# Patient Record
Sex: Male | Born: 1980 | Race: White | Hispanic: No | Marital: Single | Smoking: Never smoker
Health system: Southern US, Community
[De-identification: ages and names within clinical notes are randomized; demographics above are authoritative.]

## PROBLEM LIST (undated history)

## (undated) DIAGNOSIS — J45909 Unspecified asthma, uncomplicated: Secondary | ICD-10-CM

## (undated) HISTORY — DX: Unspecified asthma, uncomplicated: J45.909

## (undated) HISTORY — PX: OTHER SURGICAL HISTORY: SHX169

---
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
## 1999-02-11 ENCOUNTER — Ambulatory Visit (HOSPITAL_COMMUNITY): Admission: RE | Admit: 1999-02-11 | Discharge: 1999-02-11 | Payer: Self-pay | Admitting: *Deleted

## 1999-02-20 ENCOUNTER — Ambulatory Visit (HOSPITAL_COMMUNITY): Admission: RE | Admit: 1999-02-20 | Discharge: 1999-02-20 | Payer: Self-pay | Admitting: *Deleted

## 1999-02-20 ENCOUNTER — Encounter: Admission: RE | Admit: 1999-02-20 | Discharge: 1999-02-20 | Payer: Self-pay | Admitting: *Deleted

## 1999-09-10 ENCOUNTER — Ambulatory Visit (HOSPITAL_COMMUNITY): Admission: RE | Admit: 1999-09-10 | Discharge: 1999-09-10 | Payer: Self-pay | Admitting: *Deleted

## 1999-10-06 ENCOUNTER — Encounter: Admission: RE | Admit: 1999-10-06 | Discharge: 1999-10-06 | Payer: Self-pay | Admitting: *Deleted

## 1999-10-06 ENCOUNTER — Ambulatory Visit (HOSPITAL_COMMUNITY): Admission: RE | Admit: 1999-10-06 | Discharge: 1999-10-06 | Payer: Self-pay | Admitting: *Deleted

## 2000-06-25 ENCOUNTER — Ambulatory Visit (HOSPITAL_COMMUNITY): Admission: RE | Admit: 2000-06-25 | Discharge: 2000-06-25 | Payer: Self-pay | Admitting: *Deleted

## 2000-07-26 ENCOUNTER — Encounter: Admission: RE | Admit: 2000-07-26 | Discharge: 2000-07-26 | Payer: Self-pay | Admitting: *Deleted

## 2000-07-26 ENCOUNTER — Ambulatory Visit (HOSPITAL_COMMUNITY): Admission: RE | Admit: 2000-07-26 | Discharge: 2000-07-26 | Payer: Self-pay | Admitting: *Deleted

## 2009-04-26 ENCOUNTER — Encounter: Admission: RE | Admit: 2009-04-26 | Discharge: 2009-04-26 | Payer: Self-pay | Admitting: Orthopedic Surgery

## 2012-05-10 ENCOUNTER — Encounter (HOSPITAL_BASED_OUTPATIENT_CLINIC_OR_DEPARTMENT_OTHER): Payer: Self-pay

## 2012-05-10 ENCOUNTER — Emergency Department (HOSPITAL_BASED_OUTPATIENT_CLINIC_OR_DEPARTMENT_OTHER)
Admission: EM | Admit: 2012-05-10 | Discharge: 2012-05-10 | Disposition: A | Discharge status: Home / Self Care | Payer: 59 | Attending: Emergency Medicine | Admitting: Emergency Medicine

## 2012-05-10 DIAGNOSIS — N419 Inflammatory disease of prostate, unspecified: Secondary | ICD-10-CM | POA: Insufficient documentation

## 2012-05-10 DIAGNOSIS — R350 Frequency of micturition: Secondary | ICD-10-CM | POA: Insufficient documentation

## 2012-05-10 DIAGNOSIS — M545 Low back pain, unspecified: Secondary | ICD-10-CM | POA: Insufficient documentation

## 2012-05-10 DIAGNOSIS — N390 Urinary tract infection, site not specified: Secondary | ICD-10-CM | POA: Insufficient documentation

## 2012-05-10 LAB — URINALYSIS, ROUTINE W REFLEX MICROSCOPIC
Glucose, UA: NEGATIVE mg/dL
Specific Gravity, Urine: 1.01 (ref 1.005–1.030)

## 2012-05-10 LAB — URINE MICROSCOPIC-ADD ON

## 2012-05-10 MED ORDER — CEFIXIME 400 MG PO TABS
400.0000 mg | ORAL_TABLET | Freq: Once | ORAL | Status: AC
  Administered 2012-05-10: 400 mg via ORAL
  Filled 2012-05-10: qty 1

## 2012-05-10 MED ORDER — CIPROFLOXACIN HCL 500 MG PO TABS
500.0000 mg | ORAL_TABLET | Freq: Once | ORAL | Status: AC
Start: 2012-05-10 — End: 2012-05-10
  Administered 2012-05-10: 500 mg via ORAL
  Filled 2012-05-10: qty 1

## 2012-05-10 MED ORDER — HYDROCODONE-ACETAMINOPHEN 5-325 MG PO TABS: 1.0000 | ORAL_TABLET | ORAL | Status: DC | PRN

## 2012-05-10 MED ORDER — CIPROFLOXACIN HCL 500 MG PO TABS: 500.0000 mg | ORAL_TABLET | Freq: Two times a day (BID) | ORAL | Status: DC

## 2012-05-10 NOTE — ED Notes (Signed)
Low back, low abdominal pain and urinary frequency that started Saturday.

## 2012-05-10 NOTE — ED Provider Notes (Signed)
History     CSN: [id number]  Arrival date & time 05/10/12  4098   First MD Initiated Contact with Patient 05/10/12 1857      Chief Complaint  Patient presents with  . Back Pain  . Abdominal Pain  . Urinary Frequency    (Consider location/radiation/quality/duration/timing/severity/associated sxs/prior treatment) Patient is a 31 y.o. male presenting with abdominal pain and frequency. The history is provided by the patient.  Abdominal Pain The primary symptoms of the illness include abdominal pain. The primary symptoms of the illness do not include shortness of breath or dysuria.  Additional symptoms associated with the illness include chills and frequency. Symptoms associated with the illness do not include hematuria or back pain.  Urinary Frequency This is a new problem. The current episode started in the past 7 days. The problem occurs daily. The problem has been gradually worsening. Associated symptoms include abdominal pain and chills. Pertinent negatives include no arthralgias, chest pain, coughing or neck pain. Nothing aggravates the symptoms. He has tried nothing for the symptoms.    History reviewed. No pertinent past medical history.  History reviewed. No pertinent past surgical history.  No family history on file.  History  Substance Use Topics  . Smoking status: Never Smoker   . Smokeless tobacco: Not on file  . Alcohol Use: Yes     weekly      Review of Systems  Constitutional: Positive for chills. Negative for activity change.       All ROS Neg except as noted in HPI  HENT: Negative for nosebleeds and neck pain.   Eyes: Negative for photophobia and discharge.  Respiratory: Negative for cough, shortness of breath and wheezing.   Cardiovascular: Negative for chest pain and palpitations.  Gastrointestinal: Positive for abdominal pain. Negative for blood in stool.  Genitourinary: Positive for frequency. Negative for dysuria and hematuria.  Musculoskeletal:  Negative for back pain and arthralgias.  Skin: Negative.   Neurological: Negative for dizziness, seizures and speech difficulty.  Psychiatric/Behavioral: Negative for hallucinations and confusion.    Allergies  Review of patient's allergies indicates no known allergies.  Home Medications  No current outpatient prescriptions on file.  BP 124/79  Pulse 98  Temp 98.9 F (37.2 C) (Oral)  Resp 16  Ht 5\' 5"  (1.651 m)  Wt 148 lb (67.132 kg)  BMI 24.63 kg/m2  SpO2 100%  Physical Exam  Nursing note and vitals reviewed. Constitutional: He is oriented to person, place, and time. He appears well-developed and well-nourished.  Non-toxic appearance.  HENT:  Head: Normocephalic.  Right Ear: Tympanic membrane and external ear normal.  Left Ear: Tympanic membrane and external ear normal.  Eyes: EOM and lids are normal. Pupils are equal, round, and reactive to light.  Neck: Normal range of motion. Neck supple. Carotid bruit is not present.  Cardiovascular: Normal rate, regular rhythm, normal heart sounds, intact distal pulses and normal pulses.   Pulmonary/Chest: Breath sounds normal. No respiratory distress.  Abdominal: Soft. Bowel sounds are normal. There is no tenderness. There is no guarding.  Genitourinary:       Is no mass or hemorrhoid the external anal area. There is no sphincter mass appreciated. No evidence of fistula. The prostate is enlarged and tender to palpation. Stool is negative for occult blood.  Musculoskeletal: Normal range of motion.       Mild to moderate lower back area pain with range of motion exercises. No hot areas appreciated.  Lymphadenopathy:  Head (right side): No submandibular adenopathy present.       Head (left side): No submandibular adenopathy present.    He has no cervical adenopathy.  Neurological: He is alert and oriented to person, place, and time. He has normal strength. No cranial nerve deficit or sensory deficit.  Skin: Skin is warm and dry.    Psychiatric: He has a normal mood and affect. His speech is normal.    ED Course  Procedures (including critical care time)  Labs Reviewed  URINALYSIS, ROUTINE W REFLEX MICROSCOPIC - Abnormal; Notable for the following:    Color, Urine ORANGE (*)  BIOCHEMICALS MAY BE AFFECTED BY COLOR   Nitrite POSITIVE (*)     Leukocytes, UA MODERATE (*)     All other components within normal limits  URINE MICROSCOPIC-ADD ON - Abnormal; Notable for the following:    Bacteria, UA FEW (*)     All other components within normal limits   No results found.   No diagnosis found.    MDM  I have reviewed nursing notes, vital signs, and all appropriate lab and imaging results for this patient. Pt has pain of the prostate and UA suggest uti. No fever or n/v. Plan for urology follow up. Rx for cipro and norco given. Culture of urine given to the patient.  Kathie Dike, [place] 05/13/12 [phone]

## 2012-05-10 NOTE — Discharge Instructions (Signed)
please see the urology specialist listed above for evaluation of your prostate and recheck of your urine. Please use the Cipro daily until all taken, please take with food. Use Tylenol or ibuprofen for mild pain, use Norco for more severe pain. This medication may cause drowsiness, please use with caution.Prostatitis Prostatitis is an infection or injury of the prostate gland. This gland is located at the base of the bladder. Prostatitis is usually caused by the same bacteria that cause kidney and bladder infections. It can also be due to bacteria picked up by sexual contact. Symptoms of this condition include: chills, fever, low back or groin pain. There may also be an urge to urinate frequently with difficulty emptying the bladder. Treatment of acute prostatitis includes:  Antibiotic drugs for 2 to 4 weeks. Pain medicine may also be needed.   Rest for 2 to 4 days and try warm sitz baths several times daily.   Increased oral fluids. Avoid coffee, alcohol, and spicy foods which irritate the urethra.  Prostatitis can become a chronic infection if it is not treated properly. You should avoid all strenuous activity or long car rides until all your symptoms are better. Please see your caregiver as suggested for follow up care. Follow-up after treatment for prostatitis is important to be sure that the infection is completely gone. SEEK IMMEDIATE MEDICAL CARE IF: You develop shaking chills, a fever greater than 102 F (38.9 C), blood in the urine or urinary blockage. Document Released: 08/13/2004 Document Revised: 06/25/2011 Document Reviewed: 01/22/2009 [facility] Patient Information 2012 [place], [place].

## 2012-05-12 LAB — URINE CULTURE

## 2012-05-13 NOTE — ED Provider Notes (Signed)
Medical screening examination/treatment/procedure(s) were performed by non-physician practitioner and as supervising physician I was immediately available for consultation/collaboration.    Celene Kras, MD 05/13/12 [phone]

## 2014-04-01 DIAGNOSIS — J45909 Unspecified asthma, uncomplicated: Secondary | ICD-10-CM | POA: Insufficient documentation

## 2014-04-02 ENCOUNTER — Encounter: Payer: Self-pay | Admitting: Internal Medicine

## 2014-04-02 ENCOUNTER — Ambulatory Visit (INDEPENDENT_AMBULATORY_CARE_PROVIDER_SITE_OTHER): Payer: 59 | Admitting: Internal Medicine

## 2014-04-02 VITALS — BP 100/66 | HR 64 | Temp 98.1°F | Resp 16 | Ht 65.75 in | Wt 164.0 lb

## 2014-04-02 DIAGNOSIS — Z0000 Encounter for general adult medical examination without abnormal findings: Secondary | ICD-10-CM

## 2014-04-02 DIAGNOSIS — Z111 Encounter for screening for respiratory tuberculosis: Secondary | ICD-10-CM

## 2014-04-02 DIAGNOSIS — J4521 Mild intermittent asthma with (acute) exacerbation: Secondary | ICD-10-CM

## 2014-04-02 DIAGNOSIS — Z1212 Encounter for screening for malignant neoplasm of rectum: Secondary | ICD-10-CM

## 2014-04-02 DIAGNOSIS — R740 Nonspecific elevation of levels of transaminase and lactic acid dehydrogenase [LDH]: Secondary | ICD-10-CM

## 2014-04-02 DIAGNOSIS — E559 Vitamin D deficiency, unspecified: Secondary | ICD-10-CM

## 2014-04-02 DIAGNOSIS — Z113 Encounter for screening for infections with a predominantly sexual mode of transmission: Secondary | ICD-10-CM

## 2014-04-02 DIAGNOSIS — R7401 Elevation of levels of liver transaminase levels: Secondary | ICD-10-CM

## 2014-04-02 DIAGNOSIS — R7402 Elevation of levels of lactic acid dehydrogenase (LDH): Secondary | ICD-10-CM

## 2014-04-02 DIAGNOSIS — J45901 Unspecified asthma with (acute) exacerbation: Secondary | ICD-10-CM

## 2014-04-02 DIAGNOSIS — Z889 Allergy status to unspecified drugs, medicaments and biological substances status: Secondary | ICD-10-CM

## 2014-04-02 LAB — CBC WITH DIFFERENTIAL/PLATELET
BASOS PCT: 0 % (ref 0–1)
Basophils Absolute: 0 10*3/uL (ref 0.0–0.1)
EOS ABS: 0.3 10*3/uL (ref 0.0–0.7)
Eosinophils Relative: 4 % (ref 0–5)
HCT: 43.1 % (ref 39.0–52.0)
Hemoglobin: 15.2 g/dL (ref 13.0–17.0)
LYMPHS PCT: 29 % (ref 12–46)
Lymphs Abs: 2.1 10*3/uL (ref 0.7–4.0)
MCH: 31.6 pg (ref 26.0–34.0)
MCHC: 35.3 g/dL (ref 30.0–36.0)
MCV: 89.6 fL (ref 78.0–100.0)
MONOS PCT: 8 % (ref 3–12)
Monocytes Absolute: 0.6 10*3/uL (ref 0.1–1.0)
Neutro Abs: 4.2 10*3/uL (ref 1.7–7.7)
Neutrophils Relative %: 59 % (ref 43–77)
PLATELETS: 208 10*3/uL (ref 150–400)
RBC: 4.81 MIL/uL (ref 4.22–5.81)
RDW: 13.2 % (ref 11.5–15.5)
WBC: 7.1 10*3/uL (ref 4.0–10.5)

## 2014-04-02 MED ORDER — FINASTERIDE 5 MG PO TABS: 5.0000 mg | ORAL_TABLET | Freq: Every day | ORAL | Status: DC

## 2014-04-02 NOTE — Patient Instructions (Addendum)
Recommend the book "The END of DIETING" by Dr Baker Janus   and the book "The END of DIABETES " by Dr Excell Seltzer  At [email or web address] - get book & Audio CD's      Being diabetic has a  300% increased risk for heart attack, stroke, cancer, and alzheimer- type vascular dementia. It is very important that you work harder with diet by avoiding all foods that are white except chicken & fish. Avoid white rice (brown & wild rice is OK), white potatoes (sweetpotatoes in moderation is OK), White bread or wheat bread or anything made out of white flour like bagels, donuts, rolls, buns, biscuits, cakes, pastries, cookies, pizza crust, and pasta (made from white flour & egg whites) - vegetarian pasta or spinach or wheat pasta is OK. Multigrain breads like Arnold's or [employer], or multigrain sandwich thins or flatbreads.  Diet, exercise and weight loss can reverse and cure diabetes in the early stages.  Diet, exercise and weight loss is very important in the control and prevention of complications of diabetes which affects every system in your body, ie. Brain - dementia/stroke, eyes - glaucoma/blindness, heart - heart attack/heart failure, kidneys - dialysis, stomach - gastric paralysis, intestines - malabsorption, nerves - severe painful neuritis, circulation - gangrene & loss of a leg(s), and finally cancer and Alzheimers.    I recommend avoid fried & greasy foods,  sweets/candy, white rice (brown or wild rice or Quinoa is OK), white potatoes (sweet potatoes are OK) - anything made from white flour - bagels, doughnuts, rolls, buns, biscuits,white and wheat breads, pizza crust and traditional pasta made of white flour & egg white(vegetarian pasta or spinach or wheat pasta is OK).  Multi-grain bread is OK - like multi-grain flat bread or sandwich thins. Avoid alcohol in excess. Exercise is also important.    Eat all the vegetables you want - avoid meat, especially red meat and dairy - especially cheese.  Cheese  is the most concentrated form of trans-fats which is the worst thing to clog up our arteries. Veggie cheese is OK which can be found in the fresh produce section at [employer] or [employer] or [employer]  Ages 34 to 34 Blood pressure check.** / Every 1 to 2 years. Lipid and cholesterol check.** / Every 5 years beginning at age 13. Hepatitis C blood test.** / For any individual with known risks for hepatitis C. Skin self-exam. / Monthly. Influenza vaccine. / Every year. Tetanus, diphtheria, and acellular pertussis (Tdap, Td) vaccine.** / Consult your health care provider. 1 dose of Td every 10 years. Varicella vaccine.** / Consult your health care provider. HPV vaccine. / 3 doses over 6 months, if 34 or younger. Measles, mumps, rubella (MMR) vaccine.** / You need at least 1 dose of MMR if you were born in 1957 or later. You may also need a second dose. Pneumococcal 13-valent conjugate (PCV13) vaccine.** / Consult your health care provider. Pneumococcal polysaccharide (PPSV23) vaccine.** / 1 to 2 doses if you smoke cigarettes or if you have certain conditions. Meningococcal vaccine.** / 1 dose if you are age 57 to 110 years and a Market researcher living in a residence hall, or have one of several medical conditions. You may also need additional booster doses. Hepatitis A vaccine.** / Consult your health care provider. Hepatitis B vaccine.** / Consult your health care provider. Haemophilus influenzae type b (Hib) vaccine.** / Consult your health care provider.  Vitamin D Deficiency Vitamin D is an important vitamin  that your body needs. Having too little of it in your body is called a deficiency. A very bad deficiency can make your bones soft and can cause a condition called rickets.  Vitamin D is important to your body for different reasons, such as:   It helps your body absorb 2 minerals called calcium and phosphorus.  It helps make your bones healthy.  It may prevent some  diseases, such as diabetes and multiple sclerosis.  It helps your muscles and heart. You can get vitamin D in several ways. It is a natural part of some foods. The vitamin is also added to some dairy products and cereals. Some people take vitamin D supplements. Also, your body makes vitamin D when you are in the sun. It changes the sun's rays into a form of the vitamin that your body can use. CAUSES   Not eating enough foods that contain vitamin D.  Not getting enough sunlight.  Having certain digestive system diseases that make it hard to absorb vitamin D. These diseases include Crohn's disease, chronic pancreatitis, and cystic fibrosis.  Having a surgery in which part of the stomach or small intestine is removed.  Being obese. Fat cells pull vitamin D out of your blood. That means that obese people may not have enough vitamin D left in their blood and in other body tissues.  Having chronic kidney or liver disease. RISK FACTORS Risk factors are things that make you more likely to develop a vitamin D deficiency. They include:  Being older.  Not being able to get outside very much.  Living in a nursing home.  Having had broken bones.  Having weak or thin bones (osteoporosis).  Having a disease or condition that changes how your body absorbs vitamin D.  Having dark skin.  Some medicines such as seizure medicines or steroids.  Being overweight or obese. SYMPTOMS Mild cases of vitamin D deficiency may not have any symptoms. If you have a very bad case, symptoms may include:  Bone pain.  Muscle pain.  Falling often.  Broken bones caused by a minor injury, due to osteoporosis. DIAGNOSIS A blood test is the best way to tell if you have a vitamin D deficiency. TREATMENT Vitamin D deficiency can be treated in different ways. Treatment for vitamin D deficiency depends on what is causing it. Options include:  Taking vitamin D supplements.  Taking a calcium supplement. Your  caregiver will suggest what dose is best for you. HOME CARE INSTRUCTIONS  Take any supplements that your caregiver prescribes. Follow the directions carefully. Take only the suggested amount.  Have your blood tested 2 months after you start taking supplements.  Eat foods that contain vitamin D. Healthy choices include:  Fortified dairy products, cereals, or juices. Fortified means vitamin D has been added to the food. Check the label on the package to be sure.  Fatty fish like salmon or trout.  Eggs.  Oysters.  Do not use a tanning bed.  Keep your weight at a healthy level. Lose weight if you need to.  Keep all follow-up appointments. Your caregiver will need to perform blood tests to make sure your vitamin D deficiency is going away. SEEK MEDICAL CARE IF:  You have any questions about your treatment.  You continue to have symptoms of vitamin D deficiency.  You have nausea or vomiting.  You are constipated.  You feel confused.  You have severe abdominal or back pain. MAKE SURE YOU:  Understand these instructions.  Will  watch your condition.

## 2014-04-02 NOTE — Progress Notes (Signed)
Patient ID: Ricky Myers, male   DOB: 02/17/1981, 33 y.o.   MRN: [id number]   Annual Screening Comprehensive Examination   This very nice 33 y.o.SWM presents for complete physical.  Patient has no major health issues.  He does take Montelukast for allergies. Patient does have Hx/o asthma which has been very quiescent over the last year.   Finally, patient has history of Vitamin D Deficiency and last vitamin D was 23 in Sept 2014.   Montelukast 10 mg QD prn Acyclovir 1000 mg qd prn  No Known Drug Allergies  Past Medical History  Diagnosis Date  . Asthma    No past surgical history on file.  FHx - Unremarkable - Both parents in good health.  History   Social History  . Marital Status: Single    Spouse Name: N/A    Number of Children: N/A  . Years of Education: N/A   Occupational History  . 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   Social History Main Topics  . Smoking status: Never Smoker   . Smokeless tobacco: Not on file  . Alcohol Use: Yes     Comment: weekly  . Drug Use: No  . Sexual Activity: Active    ROS Constitutional: Denies fever, chills, weight loss/gain, headaches, insomnia, fatigue, night sweats, and change in appetite Eyes: Denies redness, blurred vision, diplopia, discharge, itchy, watery eyes.  ENT: Denies discharge, congestion, post nasal drip, epistaxis, sore throat, earache, hearing loss, dental pain, Tinnitus, Vertigo, Sinus pain, snoring.  Cardio: Denies chest pain, palpitations, irregular heartbeat, syncope, dyspnea, diaphoresis, orthopnea, PND, claudication, edema Respiratory: denies cough, dyspnea, DOE, pleurisy, hoarseness, laryngitis, wheezing.  Gastrointestinal: Denies dysphagia, heartburn, reflux, water brash, pain, cramps, nausea, vomiting, bloating, diarrhea, constipation, hematemesis, melena, hematochezia, jaundice, hemorrhoids Genitourinary: Denies dysuria, frequency, urgency, nocturia, hesitancy, discharge, hematuria, flank pain Breast: Breast  lumps, nipple discharge, bleeding.  Musculoskeletal: Denies arthralgia, myalgia, stiffness, Jt. Swelling, pain, limp, and strain/sprain. Skin: Denies puritis, rash, hives, warts, acne, eczema, changing in skin lesion Neuro: Weakness, tremor, incoordination, spasms, paresthesia, pain Psychiatric: Denies confusion, memory loss, sensory loss. Endocrine: Denies change in weight, skin, hair change, nocturia, and paresthesia, diabetic polys, visual blurring, hyper /hypo glycemic episodes.  Heme/Lymph: No excessive bleeding, bruising, enlarged lymph nodes.  Physical Exam  BP 100/66  Pulse 64  Temp 98.1 F   Resp 16  Ht 5' 5.75"   Wt 164 lb   BMI 26.67   General Appearance: Well nourished and in no apparent distress. Eyes: PERRLA, EOMs, conjunctiva no swelling or erythema, normal fundi and vessels. Sinuses: No frontal/maxillary tenderness ENT/Mouth: EACs patent / TMs  nl. Nares clear without erythema, swelling, mucoid exudates. Oral hygiene is good. No erythema, swelling, or exudate. Tongue normal, non-obstructing. Tonsils not swollen or erythematous. Hearing normal.  Neck: Supple, thyroid normal. No bruits, nodes or JVD. Respiratory: Respiratory effort normal.  BS equal and clear bilateral without rales, rhonci, wheezing or stridor. Cardio: Heart sounds are normal with regular rate and rhythm and no murmurs, rubs or gallops. Peripheral pulses are normal and equal bilaterally without edema. No aortic or femoral bruits. Chest: symmetric with normal excursions and percussion. Abdomen: Flat, soft, with bowl sounds. Nontender, no guarding, rebound, hernias, masses, or organomegaly.  Lymphatics: Non tender without lymphadenopathy.  GU: Nl Male. No Hernias. Testes Nl. Musculoskeletal: Full ROM all peripheral extremities, joint stability, 5/5 strength, and normal gait. Skin: Warm and dry without rashes, lesions, cyanosis, clubbing or  ecchymosis.  Neuro: Cranial nerves intact, reflexes equal  bilaterally. Normal muscle tone, no cerebellar symptoms. Sensation intact.  Pysch: Awake and oriented X 3, normal affect, Insight and Judgment appropriate.   Assessment and Plan  1. Annual Screening Examination 2. Mildly elevated Trig 3.  Allergies  4.  Vit D Deficiency  5. Asthma, Hx Continue prudent diet as discussed, weight control, regular exercise, and medications. Routine screening labs and tests as requested with regular follow-up as recommended.

## 2014-04-03 LAB — MAGNESIUM: Magnesium: 2 mg/dL (ref 1.5–2.5)

## 2014-04-03 LAB — MICROALBUMIN / CREATININE URINE RATIO
Creatinine, Urine: 43.5 mg/dL
MICROALB/CREAT RATIO: 11.5 mg/g (ref 0.0–30.0)
Microalb, Ur: 0.5 mg/dL (ref 0.00–1.89)

## 2014-04-03 LAB — BASIC METABOLIC PANEL WITH GFR
BUN: 18 mg/dL (ref 6–23)
CHLORIDE: 104 meq/L (ref 96–112)
CO2: 24 meq/L (ref 19–32)
CREATININE: 1.1 mg/dL (ref 0.50–1.35)
Calcium: 9 mg/dL (ref 8.4–10.5)
GFR, Est Non African American: 87 mL/min
Glucose, Bld: 74 mg/dL (ref 70–99)
Potassium: 4.1 mEq/L (ref 3.5–5.3)
Sodium: 138 mEq/L (ref 135–145)

## 2014-04-03 LAB — HEPATITIS B CORE ANTIBODY, TOTAL: Hep B Core Total Ab: NONREACTIVE

## 2014-04-03 LAB — HEPATIC FUNCTION PANEL
ALBUMIN: 4.4 g/dL (ref 3.5–5.2)
ALK PHOS: 62 U/L (ref 39–117)
ALT: 19 U/L (ref 0–53)
AST: 22 U/L (ref 0–37)
BILIRUBIN DIRECT: 0.1 mg/dL (ref 0.0–0.3)
BILIRUBIN INDIRECT: 0.3 mg/dL (ref 0.2–1.2)
TOTAL PROTEIN: 6.6 g/dL (ref 6.0–8.3)
Total Bilirubin: 0.4 mg/dL (ref 0.2–1.2)

## 2014-04-03 LAB — URINALYSIS, MICROSCOPIC ONLY
BACTERIA UA: NONE SEEN
CASTS: NONE SEEN
CRYSTALS: NONE SEEN
SQUAMOUS EPITHELIAL / LPF: NONE SEEN

## 2014-04-03 LAB — LIPID PANEL
Cholesterol: 117 mg/dL (ref 0–200)
HDL: 43 mg/dL (ref >39)
LDL Cholesterol: 46 mg/dL (ref 0–99)
TRIGLYCERIDES: 139 mg/dL (ref <150)
Total CHOL/HDL Ratio: 2.7 Ratio
VLDL: 28 mg/dL (ref 0–40)

## 2014-04-03 LAB — HEPATITIS B SURFACE ANTIBODY,QUALITATIVE: HEP B S AB: POSITIVE — AB

## 2014-04-03 LAB — HEMOGLOBIN A1C
Hgb A1c MFr Bld: 5.6 % (ref <5.7)
Mean Plasma Glucose: 114 mg/dL (ref <117)

## 2014-04-03 LAB — TESTOSTERONE: Testosterone: 308 ng/dL (ref 300–890)

## 2014-04-03 LAB — TSH: TSH: 3.067 u[IU]/mL (ref 0.350–4.500)

## 2014-04-03 LAB — INSULIN, FASTING: INSULIN FASTING, SERUM: 11.9 u[IU]/mL (ref 2.0–19.6)

## 2014-04-03 LAB — HEPATITIS C ANTIBODY: HCV Ab: NEGATIVE

## 2014-04-03 LAB — VITAMIN D 25 HYDROXY (VIT D DEFICIENCY, FRACTURES): VIT D 25 HYDROXY: 41 ng/mL (ref 30–89)

## 2014-04-03 LAB — HEPATITIS A ANTIBODY, TOTAL: HEP A TOTAL AB: NONREACTIVE

## 2014-04-03 LAB — VITAMIN B12: Vitamin B-12: 893 pg/mL (ref 211–911)

## 2014-04-04 LAB — HEPATITIS B E ANTIBODY: Hepatitis Be Antibody: NONREACTIVE

## 2014-04-05 LAB — TB SKIN TEST
Induration: 0 mm
TB Skin Test: NEGATIVE

## 2014-11-08 ENCOUNTER — Other Ambulatory Visit: Payer: Self-pay | Admitting: *Deleted

## 2014-11-08 MED ORDER — FINASTERIDE 5 MG PO TABS: 5.0000 mg | ORAL_TABLET | Freq: Every day | ORAL | Status: AC

## 2015-04-05 ENCOUNTER — Encounter: Payer: Self-pay | Admitting: Internal Medicine

## 2015-04-17 ENCOUNTER — Other Ambulatory Visit: Payer: Self-pay | Admitting: Internal Medicine

## 2015-04-30 ENCOUNTER — Encounter: Payer: Self-pay | Admitting: Internal Medicine

## 2015-05-03 ENCOUNTER — Encounter: Payer: Self-pay | Admitting: Internal Medicine

## 2015-05-03 ENCOUNTER — Ambulatory Visit (INDEPENDENT_AMBULATORY_CARE_PROVIDER_SITE_OTHER): Payer: 59 | Admitting: Internal Medicine

## 2015-05-03 VITALS — BP 110/76 | HR 68 | Temp 97.9°F | Resp 16 | Ht 64.75 in | Wt 165.2 lb

## 2015-05-03 DIAGNOSIS — E559 Vitamin D deficiency, unspecified: Secondary | ICD-10-CM | POA: Insufficient documentation

## 2015-05-03 DIAGNOSIS — Z79899 Other long term (current) drug therapy: Secondary | ICD-10-CM | POA: Insufficient documentation

## 2015-05-03 DIAGNOSIS — Z0001 Encounter for general adult medical examination with abnormal findings: Secondary | ICD-10-CM

## 2015-05-03 DIAGNOSIS — Z0000 Encounter for general adult medical examination without abnormal findings: Secondary | ICD-10-CM

## 2015-05-03 DIAGNOSIS — R030 Elevated blood-pressure reading, without diagnosis of hypertension: Principal | ICD-10-CM

## 2015-05-03 DIAGNOSIS — Z23 Encounter for immunization: Secondary | ICD-10-CM

## 2015-05-03 DIAGNOSIS — E782 Mixed hyperlipidemia: Secondary | ICD-10-CM

## 2015-05-03 DIAGNOSIS — Z111 Encounter for screening for respiratory tuberculosis: Secondary | ICD-10-CM | POA: Diagnosis not present

## 2015-05-03 DIAGNOSIS — IMO0001 Reserved for inherently not codable concepts without codable children: Secondary | ICD-10-CM

## 2015-05-03 DIAGNOSIS — Z136 Encounter for screening for cardiovascular disorders: Secondary | ICD-10-CM | POA: Insufficient documentation

## 2015-05-03 DIAGNOSIS — R7309 Other abnormal glucose: Secondary | ICD-10-CM

## 2015-05-03 DIAGNOSIS — R5383 Other fatigue: Secondary | ICD-10-CM

## 2015-05-03 DIAGNOSIS — Z6826 Body mass index (BMI) 26.0-26.9, adult: Secondary | ICD-10-CM | POA: Insufficient documentation

## 2015-05-03 DIAGNOSIS — Z1212 Encounter for screening for malignant neoplasm of rectum: Secondary | ICD-10-CM

## 2015-05-03 NOTE — Progress Notes (Signed)
Patient ID: Ricky Myers, male   DOB: [birth year], 34 y.o.   MRN: [id number]  Annual  Screening/Preventative Comprehensive Examination  This very nice 34 y.o. SWM presents for  presents for a Wellness Visit & comprehensive evaluation and management of multiple medical co-morbidities.  Patient has been followed expectantly for elevated BP, elevated Trig's, abn glucose and Vitamin D Deficiency.   Patient's BP has been controlled and today's BP: 110/76 mmHg. Patient denies any cardiac symptoms as chest pain, palpitations, shortness of breath, dizziness or ankle swelling.   Patient's hyperlipidemia is controlled with diet and medications. Patient denies myalgias or other medication SE's. Last lipids were at goal with T Chol 117, Hdl 43, Trig 139 & LDL 46 in Sept 2014, altho in Sept 2014 he did have an elevated Trig 158.    Patient is overweight with a BMI of 27.69 and he is monitored proactively & screened for glucose intolerance and insulin resistance and patient denies reactive hypoglycemic symptoms, visual blurring, diabetic polys or paresthesias. Last A1c was 5.6% in Sept 2015 with an average glucose of 114 mg%.    Finally, patient has history of Vitamin D Deficiency of 36 in Sept 2014  and last vitamin D was still low at 32 in Sept 2015.    Medication Sig  . ACYCLOVIR PO Take 1,000 mg by mouth. Patient takes PRN  . finasteride (PROSCAR) 5 MG tablet Take 1 tablet (5 mg total) by mouth daily.  . montelukast (SINGULAIR) 10 MG tablet Take 10 mg by mouth at bedtime.   No Known Allergies   Past Medical History  Diagnosis Date  . Asthma    Health Maintenance  Topic Date Due  . HIV Screening  11/17/1994  . INFLUENZA VACCINE  02/18/2015  . TETANUS/TDAP  05/02/2025   Immunization History  Administered Date(s) Administered  . PPD Test 04/02/2014, 05/03/2015  . Pneumococcal Polysaccharide-23 06/19/2012  . Tdap 05/03/2015   No past surgical history on file. No family history on file.  Social  History   Social History  . Marital Status: Single    Spouse Name: N/A  . Number of Children: N/A  . Years of Education: N/A   Occupational History  . Not on file.   Social History Main Topics  . Smoking status: Never Smoker   . Smokeless tobacco: Not on file  . Alcohol Use: 0.0 oz/week    0 Standard drinks or equivalent per week     Comment: 1 to 6 beers weekly  . Drug Use: No  . Sexual Activity: Not on file   Other Topics Concern  . Not on file   Social History Narrative    ROS Constitutional: Denies fever, chills, weight loss/gain, headaches, insomnia, fatigue, night sweats or change in appetite. Eyes: Denies redness, blurred vision, diplopia, discharge, itchy or watery eyes.  ENT: Denies discharge, congestion, post nasal drip, epistaxis, sore throat, earache, hearing loss, dental pain, Tinnitus, Vertigo, Sinus pain or snoring.  Cardio: Denies chest pain, palpitations, irregular heartbeat, syncope, dyspnea, diaphoresis, orthopnea, PND, claudication or edema Respiratory: denies cough, dyspnea, DOE, pleurisy, hoarseness, laryngitis or wheezing.  Gastrointestinal: Denies dysphagia, heartburn, reflux, water brash, pain, cramps, nausea, vomiting, bloating, diarrhea, constipation, hematemesis, melena, hematochezia, jaundice or hemorrhoids Genitourinary: Denies dysuria, frequency, urgency, nocturia, hesitancy, discharge, hematuria or flank pain Musculoskeletal: Denies arthralgia, myalgia, stiffness, Jt. Swelling, pain, limp or strain/sprain. Denies Falls. Skin: Denies puritis, rash, hives, warts, acne, eczema or change in skin lesion Neuro: No weakness, tremor, incoordination, spasms, paresthesia or  pain Psychiatric: Denies confusion, memory loss or sensory loss. Denies Depression. Endocrine: Denies change in weight, skin, hair change, nocturia, and paresthesia, diabetic polys, visual blurring or hyper / hypo glycemic episodes.  Heme/Lymph: No excessive bleeding, bruising or  enlarged lymph nodes.  Physical Exam  BP 110/76 mmHg  Pulse 68  Temp(Src) 97.9 F (36.6 C)  Resp 16  Ht 5' 4.75" (1.645 m)  Wt 165 lb 3.2 oz (74.934 kg)  BMI 27.69 kg/m2  General Appearance: Well nourished, in no apparent distress. Eyes: PERRLA, EOMs, conjunctiva no swelling or erythema, normal fundi and vessels. Sinuses: No frontal/maxillary tenderness ENT/Mouth: EACs patent / TMs  nl. Nares clear without erythema, swelling, mucoid exudates. Oral hygiene is good. No erythema, swelling, or exudate. Tongue normal, non-obstructing. Tonsils not swollen or erythematous. Hearing normal.  Neck: Supple, thyroid normal. No bruits, nodes or JVD. Respiratory: Respiratory effort normal.  BS equal and clear bilateral without rales, rhonci, wheezing or stridor. Cardio: Heart sounds are normal with regular rate and rhythm and no murmurs, rubs or gallops. Peripheral pulses are normal and equal bilaterally without edema. No aortic or femoral bruits. Chest: symmetric with normal excursions and percussion.  Abdomen: Flat, soft, with bowl sounds. Nontender, no guarding, rebound, hernias, masses, or organomegaly.  Lymphatics: Non tender without lymphadenopathy.  Genitourinary: No hernias.Testes nl. DRE - deferred for age. Musculoskeletal: Full ROM all peripheral extremities, joint stability, 5/5 strength, and normal gait. Skin: Warm and dry without rashes, lesions, cyanosis, clubbing or  ecchymosis.  Neuro: Cranial nerves intact, reflexes equal bilaterally. Normal muscle tone, no cerebellar symptoms. Sensation intact.  Pysch: Awake and oriented X 3 with normal affect, insight and judgment appropriate.   Assessment and Plan   1. Encounter for general adult medical examination with abnormal findings   2. Elevated BP, screening  - TSH  3. Elevated cholesterol with elevated triglycerides  - Lipid panel  4. Abnormal glucose, screening  - Hemoglobin A1c - Insulin, random  5. Vitamin D  deficiency  - Vit D  25 hydroxy   6. Screening for rectal cancer  - POC Hemoccult Bld/Stl   7. Other fatigue  - Vitamin B12 - Testosterone - Iron and TIBC - TSH  8. BMI 27.69,  adult   9. Need for prophylactic vaccination with combined diphtheria-tetanus-pertussis (DTP) vaccine  - Tdap vaccine greater than or equal to 7yo IM  10. Screening examination for pulmonary tuberculosis  - PPD  11. Medication management  - Urinalysis, Routine w reflex microscopic  - CBC with Differential/Platelet - BASIC METABOLIC PANEL WITH GFR - Hepatic function panel - Magnesium   Continue prudent diet as discussed, weight control, BP monitoring, regular exercise, and medications as discussed.  Discussed med effects and SE's. Routine screening labs and tests as requested with regular follow-up as recommended.

## 2015-05-03 NOTE — Patient Instructions (Signed)
Recommend Adult Low Dose Aspirin or   coated  Aspirin 81 mg daily   To reduce risk of Colon Cancer 20 %,   Skin Cancer 26 % ,   Melanoma 46%   and   Pancreatic cancer 60%   ++++++++++++++++++++++++++++++++++++++++++++++++++++++  Vitamin D goal   is between 70-100.   Please make sure that you are taking your Vitamin D as directed.   It is very important as a natural anti-inflammatory   helping hair, skin, and nails, as well as reducing stroke and heart attack risk.   It helps your bones and helps with mood.  It also decreases numerous cancer risks so please take it as directed.   Low Vit D is associated with a 200-300% higher risk for CANCER   and 200-300% higher risk for HEART   ATTACK  &  STROKE.   ......................................  It is also associated with higher death rate at younger ages,   autoimmune diseases like Rheumatoid arthritis, Lupus, Multiple Sclerosis.     Also many other serious conditions, like depression, Alzheimer's  Dementia, infertility, muscle aches, fatigue, fibromyalgia - just to name a few.  ++++++++++++++++++++++++++++++++++++++++++++++++  Recommend the book "The END of DIETING" by Dr Joel Fuhrman   & the book "The END of DIABETES " by Dr Joel Fuhrman  At [employer] - get book & Audio CD's     Being diabetic has a  300% increased risk for heart attack, stroke, cancer, and alzheimer- type vascular dementia. It is very important that you work harder with diet by avoiding all foods that are white. Avoid white rice (brown & wild rice is OK), white potatoes (sweetpotatoes in moderation is OK), White bread or wheat bread or anything made out of white flour like bagels, donuts, rolls, buns, biscuits, cakes, pastries, cookies, pizza crust, and pasta (made from white flour & egg whites) - vegetarian pasta or spinach or wheat pasta is OK. Multigrain breads like Arnold's or [employer], or multigrain sandwich thins or flatbreads.  Diet,  exercise and weight loss can reverse and cure diabetes in the early stages.  Diet, exercise and weight loss is very important in the control and prevention of complications of diabetes which affects every system in your body, ie. Brain - dementia/stroke, eyes - glaucoma/blindness, heart - heart attack/heart failure, kidneys - dialysis, stomach - gastric paralysis, intestines - malabsorption, nerves - severe painful neuritis, circulation - gangrene & loss of a leg(s), and finally cancer and Alzheimers.    I recommend avoid fried & greasy foods,  sweets/candy, white rice (brown or wild rice or Quinoa is OK), white potatoes (sweet potatoes are OK) - anything made from white flour - bagels, doughnuts, rolls, buns, biscuits,white and wheat breads, pizza crust and traditional pasta made of white flour & egg white(vegetarian pasta or spinach or wheat pasta is OK).  Multi-grain bread is OK - like multi-grain flat bread or sandwich thins. Avoid alcohol in excess. Exercise is also important.    Eat all the vegetables you want - avoid meat, especially red meat and dairy - especially cheese.  Cheese is the most concentrated form of trans-fats which is the worst thing to clog up our arteries. Veggie cheese is OK which can be found in the fresh produce section at [employer] or [employer] or [employer]  ++++++++++++++++++++++++++++++++++++++++++++++++++ DASH Eating Plan  DASH stands for "Dietary Approaches to Stop Hypertension."   The DASH eating plan is a healthy eating plan that has been shown to reduce high   blood pressure (hypertension). Additional health benefits may include reducing the risk of type 2 diabetes mellitus, heart disease, and stroke. The DASH eating plan may also help with weight loss.  WHAT DO I NEED TO KNOW ABOUT THE DASH EATING PLAN? For the DASH eating plan, you will follow these general guidelines:  Choose foods with a percent daily value for sodium of less than 5% (as listed on the food  label).  Use salt-free seasonings or herbs instead of table salt or sea salt.  Check with your health care provider or pharmacist before using salt substitutes.  Eat lower-sodium products, often labeled as "lower sodium" or "no salt added."  Eat fresh foods.  Eat more vegetables, fruits, and low-fat dairy products.    Choose whole grains. Look for the word "whole" as the first word in the ingredient list.  Choose fish   Limit sweets, desserts, sugars, and sugary drinks.  Choose heart-healthy fats.  Eat veggie cheese   Eat more home-cooked food and less restaurant, buffet, and fast food.  Limit fried foods.  Arnold foods using methods other than frying.  Limit canned vegetables. If you do use them, rinse them well to decrease the sodium.  When eating at a restaurant, ask that your food be prepared with less salt, or no salt if possible.                      WHAT FOODS CAN I EAT?  Seek help from a dietitian for individual calorie needs. Grains Whole grain or whole wheat bread. Brown rice. Whole grain or whole wheat pasta. Quinoa, bulgur, and whole grain cereals. Low-sodium cereals. Corn or whole wheat flour tortillas. Whole grain cornbread. Whole grain crackers. Low-sodium crackers.  Vegetables Fresh or frozen vegetables (raw, steamed, roasted, or grilled). Low-sodium or reduced-sodium tomato and vegetable juices. Low-sodium or reduced-sodium tomato sauce and paste. Low-sodium or reduced-sodium canned vegetables.   Fruits All fresh, canned (in natural juice), or frozen fruits.  Meat and Other Protein Products  All fish and seafood.  Dried beans, peas, or lentils. Unsalted nuts and seeds. Unsalted canned beans. Dairy Low-fat dairy products, such as skim or 1% milk, 2% or reduced-fat cheeses, low-fat ricotta or cottage cheese, or plain low-fat yogurt. Low-sodium or reduced-sodium cheeses.  Fats and Oils Tub margarines without trans fats. Light or reduced-fat mayonnaise  and salad dressings (reduced sodium). Avocado. Safflower, olive, or canola oils. Natural peanut or almond butter.  Other Unsalted popcorn and pretzels. The items listed above may not be a complete list of recommended foods or beverages. Contact your dietitian for more options.  +++++++++++++++++++++++++++++++++++++++++++  WHAT FOODS ARE NOT RECOMMENDED?  Grains/ White flour or wheat flour  White bread. White pasta. White rice. Refined cornbread. Bagels and croissants. Crackers that contain trans fat.  Vegetables  Creamed or fried vegetables. Vegetables in a . Regular canned vegetables. Regular canned tomato sauce and paste. Regular tomato and vegetable juices.  Fruits Dried fruits. Canned fruit in light or heavy syrup. Fruit juice.  Meat and Other Protein Products Meat in general. Fatty cuts of meat. Ribs, chicken wings, bacon, sausage, bologna, salami, chitterlings, fatback, hot dogs, bratwurst, and packaged luncheon meats. Salted nuts and seeds. Canned beans with salt.  Dairy Whole or 2% milk, cream, half-and-half, and cream cheese. Whole-fat or sweetened yogurt. Full-fat cheeses or blue cheese. Nondairy creamers and whipped toppings. Processed cheese, cheese spreads, or cheese curds.  Condiments Onion and garlic salt, seasoned salt, table salt, and sea  salt. Canned and packaged gravies. Worcestershire sauce. Tartar sauce. Barbecue sauce. Teriyaki sauce. Soy sauce, including reduced sodium. Steak sauce. Fish sauce. Oyster sauce. Cocktail sauce. Horseradish. Ketchup and mustard. Meat flavorings and tenderizers. Bouillon cubes. Hot sauce. Tabasco sauce. Marinades. Taco seasonings. Relishes.  Fats and Oils Butter, stick margarine, lard, shortening, ghee, and bacon fat. Coconut, palm kernel, or palm oils. Regular salad dressings.  Pickles and olives. Salted popcorn and pretzels. The items listed above may not be a complete list of foods and beverages to avoid.   Preventive Care for  Adults  A healthy lifestyle and preventive care can promote health and wellness. Preventive health guidelines for men include the following key practices:  A routine yearly physical is a good way to check with your health care provider about your health and preventative screening. It is a chance to share any concerns and updates on your health and to receive a thorough exam.  Visit your dentist for a routine exam and preventative care every 6 months. Brush your teeth twice a day and floss once a day. Good oral hygiene prevents tooth decay and gum disease.  The frequency of eye exams is based on your age, health, family medical history, use of contact lenses, and other factors. Follow your health care provider's recommendations for frequency of eye exams.  Eat a healthy diet. Foods such as vegetables, fruits, whole grains, low-fat dairy products, and lean protein foods contain the nutrients you need without too many calories. Decrease your intake of foods high in solid fats, added sugars, and salt. Eat the right amount of calories for you.Get information about a proper diet from your health care provider, if necessary.  Regular physical exercise is one of the most important things you can do for your health. Most adults should get at least 150 minutes of moderate-intensity exercise (any activity that increases your heart rate and causes you to sweat) each week. In addition, most adults need muscle-strengthening exercises on 2 or more days a week.  Maintain a healthy weight. The body mass index (BMI) is a screening tool to identify possible weight problems. It provides an estimate of body fat based on height and weight. Your health care provider can find your BMI and can help you achieve or maintain a healthy weight.For adults 20 years and older:  A BMI below 18.5 is considered underweight.  A BMI of 18.5 to 24.9 is normal.  A BMI of 25 to 29.9 is considered overweight.  A BMI of 30 and above  is considered obese.  Maintain normal blood lipids and cholesterol levels by exercising and minimizing your intake of saturated fat. Eat a balanced diet with plenty of fruit and vegetables. Blood tests for lipids and cholesterol should begin at age 72 and be repeated every 5 years. If your lipid or cholesterol levels are high, you are over 50, or you are at high risk for heart disease, you may need your cholesterol levels checked more frequently.Ongoing high lipid and cholesterol levels should be treated with medicines if diet and exercise are not working.  If you smoke, find out from your health care provider how to quit. If you do not use tobacco, do not start.  Lung cancer screening is recommended for adults aged 57-80 years who are at high risk for developing lung cancer because of a history of smoking. A yearly low-dose CT scan of the lungs is recommended for people who have at least a 30-pack-year history of smoking and are a  current smoker or have quit within the past 15 years. A pack year of smoking is smoking an average of 1 pack of cigarettes a day for 1 year (for example: 1 pack a day for 30 years or 2 packs a day for 15 years). Yearly screening should continue until the smoker has stopped smoking for at least 15 years. Yearly screening should be stopped for people who develop a health problem that would prevent them from having lung cancer treatment.  If you choose to drink alcohol, do not have more than 2 drinks per day. One drink is considered to be 12 ounces (355 mL) of beer, 5 ounces (148 mL) of wine, or 1.5 ounces (44 mL) of liquor.  High blood pressure causes heart disease and increases the risk of stroke. Your blood pressure should be checked. Ongoing high blood pressure should be treated with medicines, if weight loss and exercise are not effective.  If you are 45-79 years old, ask your health care provider if you should take aspirin to prevent heart disease.  Diabetes screening  involves taking a blood sample to check your fasting blood sugar level. Testing should be considered at a younger age or be carried out more frequently if you are overweight and have at least 1 risk factor for diabetes.  Colorectal cancer can be detected and often prevented. Most routine colorectal cancer screening begins at the age of 50 and continues through age 75. However, your health care provider may recommend screening at an earlier age if you have risk factors for colon cancer. On a yearly basis, your health care provider may provide home test kits to check for hidden blood in the stool. Use of a small camera at the end of a tube to directly examine the colon (sigmoidoscopy or colonoscopy) can detect the earliest forms of colorectal cancer. Talk to your health care provider about this at age 50, when routine screening begins. Direct exam of the colon should be repeated every 5-10 years through age 75, unless early forms of precancerous polyps or small growths are found.  Screening for abdominal aortic aneurysm (AAA)  are recommended for persons over age 50 who have history of hypertensionor who are current or former smokers.  Talk with your health care provider about prostate cancer screening.  Testicular cancer screening is recommended for adult males. Screening includes self-exam, a health care provider exam, and other screening tests. Consult with your health care provider about any symptoms you have or any concerns you have about testicular cancer.  Use sunscreen. Apply sunscreen liberally and repeatedly throughout the day. You should seek shade when your shadow is shorter than you. Protect yourself by wearing long sleeves, pants, a wide-brimmed hat, and sunglasses year round, whenever you are outdoors.  Once a month, do a whole-body skin exam, using a mirror to look at the skin on your back. Tell your health care provider about new moles, moles that have irregular borders, moles that are  larger than a pencil eraser, or moles that have changed in shape or color.  Stay current with required vaccines (immunizations).  Influenza vaccine. All adults should be immunized every year.  Tetanus, diphtheria, and acellular pertussis (Td, Tdap) vaccine. An adult who has not previously received Tdap or who does not know his vaccine status should receive 1 dose of Tdap. This initial dose should be followed by tetanus and diphtheria toxoids (Td) booster doses every 10 years. Adults with an unknown or incomplete history of completing a 3-dose   immunization series with Td-containing vaccines should begin or complete a primary immunization series including a Tdap dose. Adults should receive a Td booster every 10 years.  Zoster vaccine. One dose is recommended for adults aged 30 years or older unless certain conditions are present.    Pneumococcal 13-valent conjugate (PCV13) vaccine. When indicated, a person who is uncertain of his immunization history and has no record of immunization should receive the PCV13 vaccine. An adult aged 24 years or older who has certain medical conditions and has not been previously immunized should receive 1 dose of PCV13 vaccine. This PCV13 should be followed with a dose of pneumococcal polysaccharide (PPSV23) vaccine. The PPSV23 vaccine dose should be obtained at least 8 weeks after the dose of PCV13 vaccine. An adult aged 81 years or older who has certain medical conditions and previously received 1 or more doses of PPSV23 vaccine should receive 1 dose of PCV13. The PCV13 vaccine dose should be obtained 1 or more years after the last PPSV23 vaccine dose.    Pneumococcal polysaccharide (PPSV23) vaccine. When PCV13 is also indicated, PCV13 should be obtained first. All adults aged 55 years and older should be immunized. An adult younger than age 17 years who has certain medical conditions should be immunized. Any person who resides in a nursing home or long-term care  facility should be immunized. An adult smoker should be immunized. People with an immunocompromised condition and certain other conditions should receive both PCV13 and PPSV23 vaccines. People with human immunodeficiency virus (HIV) infection should be immunized as soon as possible after diagnosis. Immunization during chemotherapy or radiation therapy should be avoided. Routine use of PPSV23 vaccine is not recommended for American Indians, [place] Natives, or people younger than 65 years unless there are medical conditions that require PPSV23 vaccine. When indicated, people who have unknown immunization and have no record of immunization should receive PPSV23 vaccine. One-time revaccination 5 years after the first dose of PPSV23 is recommended for people aged 19-64 years who have chronic kidney failure, nephrotic syndrome, asplenia, or immunocompromised conditions. People who received 1-2 doses of PPSV23 before age 17 years should receive another dose of PPSV23 vaccine at age 63 years or later if at least 5 years have passed since the previous dose. Doses of PPSV23 are not needed for people immunized with PPSV23 at or after age 81 years.  Hepatitis A vaccine. Adults who wish to be protected from this disease, have certain high-risk conditions, work with hepatitis A-infected animals, work in hepatitis A research labs, or travel to or work in countries with a high rate of hepatitis A should be immunized. Adults who were previously unvaccinated and who anticipate close contact with an international adoptee during the first 60 days after arrival in the [place] from a country with a high rate of hepatitis A should be immunized.  Hepatitis B vaccine. Adults should be immunized if they wish to be protected from this disease, have certain high-risk conditions, may be exposed to blood or other infectious body fluids, are household contacts or sex partners of hepatitis B positive people, are clients or workers in  certain care facilities, or travel to or work in countries with a high rate of hepatitis B.  Preventive Service / Frequency  Ages 39 to 26  Blood pressure check.  Lipid and cholesterol check.  Hepatitis C blood test.** / For any individual with known risks for hepatitis C.  Skin self-exam. / Monthly.  Influenza vaccine. / Every year.  Tetanus,  diphtheria, and acellular pertussis (Tdap, Td) vaccine.** / Consult your health care provider. 1 dose of Td every 10 years.  HPV vaccine. / 3 doses over 6 months, if 27 or younger.  Measles, mumps, rubella (MMR) vaccine.** / You need at least 1 dose of MMR if you were born in 1957 or later. You may also need a second dose.  Pneumococcal 13-valent conjugate (PCV13) vaccine.** / Consult your health care provider.  Pneumococcal polysaccharide (PPSV23) vaccine.** / 1 to 2 doses if you smoke cigarettes or if you have certain conditions.  Meningococcal vaccine.** / 1 dose if you are age 75 to 61 years and a Market researcher living in a residence hall, or have one of several medical conditions. You may also need additional booster doses.  Hepatitis A vaccine.** / Consult your health care provider.  Hepatitis B vaccine.** / Consult your health care provider.

## 2015-05-04 LAB — BASIC METABOLIC PANEL WITH GFR
BUN: 23 mg/dL (ref 7–25)
CHLORIDE: 103 mmol/L (ref 98–110)
CO2: 29 mmol/L (ref 20–31)
CREATININE: 1.06 mg/dL (ref 0.60–1.35)
Calcium: 10 mg/dL (ref 8.6–10.3)
GFR, Est African American: >89 mL/min (ref >=60)
GFR, Est Non African American: >89 mL/min (ref >=60)
GLUCOSE: 93 mg/dL (ref 65–99)
POTASSIUM: 4.2 mmol/L (ref 3.5–5.3)
Sodium: 138 mmol/L (ref 135–146)

## 2015-05-04 LAB — URINALYSIS, ROUTINE W REFLEX MICROSCOPIC
Bilirubin Urine: NEGATIVE
GLUCOSE, UA: NEGATIVE
HGB URINE DIPSTICK: NEGATIVE
Ketones, ur: NEGATIVE
LEUKOCYTES UA: NEGATIVE
Nitrite: NEGATIVE
PH: 6.5 (ref 5.0–8.0)
Protein, ur: NEGATIVE
SPECIFIC GRAVITY, URINE: 1.008 (ref 1.001–1.035)

## 2015-05-04 LAB — CBC WITH DIFFERENTIAL/PLATELET
BASOS ABS: 0 10*3/uL (ref 0.0–0.1)
Basophils Relative: 0 % (ref 0–1)
EOS ABS: 0.4 10*3/uL (ref 0.0–0.7)
Eosinophils Relative: 5 % (ref 0–5)
HEMATOCRIT: 46.7 % (ref 39.0–52.0)
HEMOGLOBIN: 16.1 g/dL (ref 13.0–17.0)
LYMPHS ABS: 2.3 10*3/uL (ref 0.7–4.0)
LYMPHS PCT: 31 % (ref 12–46)
MCH: 32.1 pg (ref 26.0–34.0)
MCHC: 34.5 g/dL (ref 30.0–36.0)
MCV: 93 fL (ref 78.0–100.0)
MONOS PCT: 7 % (ref 3–12)
MPV: 9.3 fL (ref 8.6–12.4)
Monocytes Absolute: 0.5 10*3/uL (ref 0.1–1.0)
NEUTROS PCT: 57 % (ref 43–77)
Neutro Abs: 4.3 10*3/uL (ref 1.7–7.7)
PLATELETS: 221 10*3/uL (ref 150–400)
RBC: 5.02 MIL/uL (ref 4.22–5.81)
RDW: 13.3 % (ref 11.5–15.5)
WBC: 7.5 10*3/uL (ref 4.0–10.5)

## 2015-05-04 LAB — HEPATIC FUNCTION PANEL
ALBUMIN: 4.6 g/dL (ref 3.6–5.1)
ALK PHOS: 63 U/L (ref 40–115)
ALT: 29 U/L (ref 9–46)
AST: 27 U/L (ref 10–40)
Bilirubin, Direct: 0.1 mg/dL (ref <=0.2)
Indirect Bilirubin: 0.5 mg/dL (ref 0.2–1.2)
TOTAL PROTEIN: 7.3 g/dL (ref 6.1–8.1)
Total Bilirubin: 0.6 mg/dL (ref 0.2–1.2)

## 2015-05-04 LAB — LIPID PANEL
Cholesterol: 158 mg/dL (ref 125–200)
HDL: 48 mg/dL (ref >=40)
LDL CALC: 71 mg/dL (ref <130)
Total CHOL/HDL Ratio: 3.3 Ratio (ref <=5.0)
Triglycerides: 196 mg/dL — ABNORMAL HIGH (ref <150)
VLDL: 39 mg/dL — ABNORMAL HIGH (ref <30)

## 2015-05-04 LAB — IRON AND TIBC
%SAT: 59 % (ref 15–60)
Iron: 156 ug/dL (ref 50–180)
TIBC: 266 ug/dL (ref 250–425)
UIBC: 110 ug/dL — AB (ref 125–400)

## 2015-05-04 LAB — HEMOGLOBIN A1C
HEMOGLOBIN A1C: 5.6 % (ref <5.7)
Mean Plasma Glucose: 114 mg/dL (ref <117)

## 2015-05-04 LAB — MAGNESIUM: MAGNESIUM: 2 mg/dL (ref 1.5–2.5)

## 2015-05-04 LAB — VITAMIN D 25 HYDROXY (VIT D DEFICIENCY, FRACTURES): VIT D 25 HYDROXY: 35 ng/mL (ref 30–100)

## 2015-05-04 LAB — VITAMIN B12: VITAMIN B 12: 781 pg/mL (ref 211–911)

## 2015-05-04 LAB — TSH: TSH: 2.42 u[IU]/mL (ref 0.350–4.500)

## 2015-05-04 LAB — TESTOSTERONE: TESTOSTERONE: 506 ng/dL (ref 300–890)

## 2015-05-04 LAB — INSULIN, RANDOM: INSULIN: 25.9 u[IU]/mL — AB (ref 2.0–19.6)

## 2015-05-06 LAB — TB SKIN TEST
INDURATION: 0 mm
TB SKIN TEST: NEGATIVE

## 2015-05-20 ENCOUNTER — Other Ambulatory Visit: Payer: Self-pay | Admitting: Allergy

## 2015-05-20 MED ORDER — MONTELUKAST SODIUM 10 MG PO TABS: 10.0000 mg | ORAL_TABLET | Freq: Every day | ORAL | Status: DC

## 2015-05-22 ENCOUNTER — Other Ambulatory Visit: Payer: Self-pay | Admitting: Internal Medicine

## 2015-08-12 ENCOUNTER — Other Ambulatory Visit: Payer: Self-pay | Admitting: *Deleted

## 2015-09-10 ENCOUNTER — Other Ambulatory Visit: Payer: Self-pay | Admitting: Allergy

## 2015-09-10 MED ORDER — MONTELUKAST SODIUM 10 MG PO TABS: 10.0000 mg | ORAL_TABLET | Freq: Every day | ORAL | Status: DC

## 2015-10-28 ENCOUNTER — Other Ambulatory Visit: Payer: Self-pay | Admitting: Internal Medicine

## 2015-12-09 ENCOUNTER — Encounter: Payer: Self-pay | Admitting: Internal Medicine

## 2015-12-09 ENCOUNTER — Ambulatory Visit (INDEPENDENT_AMBULATORY_CARE_PROVIDER_SITE_OTHER): Payer: 59 | Admitting: Internal Medicine

## 2015-12-09 VITALS — BP 124/76 | HR 61 | Temp 98.6°F | Resp 16 | Ht 64.75 in | Wt 169.8 lb

## 2015-12-09 DIAGNOSIS — R55 Syncope and collapse: Secondary | ICD-10-CM

## 2015-12-09 NOTE — Progress Notes (Signed)
  Subjective:    Patient ID: Ricky Myers, male    DOB: 10-Jun-1981, 35 y.o.   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 He was checked out on site by EMS and released and 12 lead EKG is reviewed and is normal. Patient denies any aura or reported seizure activity or incontinence.  Medication Sig  . ACYCLOVIR  Take 1,000 mg by mouth. Patient takes PRN  . finasteride  5 MG tablet take 1 tablet by mouth once daily  . montelukast  10 MG tablet Take 1 tablet (10 mg total) by mouth at bedtime.  . naproxen  500 MG tablet Take 500 mg by mouth 2 (two) times daily with a meal.   No Known Allergies   Past Medical History  Diagnosis Date  . Asthma    Review of Systems  10 point systems review negative except as above.    Objective:   Physical Exam  BP 124/76 mmHg  Pulse 61  Temp(Src) 98.6 F (37 C) (Temporal)  Resp 16  Ht 5' 4.75" (1.645 m)  Wt 169 lb 12.8 oz (77.021 kg)  BMI 28.46 kg/m2  SpO2 97%  HEENT - Eac's patent. TM's Nl. EOM's full. PERRLA. NasoOroPharynx clear. Neck - supple. Nl Thyroid. Carotids 2+ & No bruits, nodes, JVD Chest - Clear equal BS w/o Rales, rhonchi, wheezes. Cor - Nl HS. RRR w/o sig MGR. PP 1(+). No edema. MS- FROM w/o deformities. Muscle power, tone and bulk Nl. Gait Nl. Neuro - No obvious Cr N abnormalities. Sensory, motor and Cerebellar functions appear Nl w/o focal abnormalities.    Assessment & Plan:   1. Vaso vagal episode  - reassured and encouraged to increase fluid intake

## 2015-12-09 NOTE — Patient Instructions (Signed)

## 2015-12-10 ENCOUNTER — Other Ambulatory Visit: Payer: Self-pay | Admitting: Allergy

## 2015-12-10 MED ORDER — MONTELUKAST SODIUM 10 MG PO TABS: 10.0000 mg | ORAL_TABLET | Freq: Every day | ORAL | Status: DC

## 2016-04-07 ENCOUNTER — Other Ambulatory Visit: Payer: Self-pay | Admitting: Allergy

## 2016-04-30 ENCOUNTER — Encounter: Payer: Self-pay | Admitting: Internal Medicine

## 2016-05-07 ENCOUNTER — Other Ambulatory Visit: Payer: Self-pay | Admitting: Pediatrics

## 2016-05-11 ENCOUNTER — Other Ambulatory Visit: Payer: Self-pay | Admitting: Allergy

## 2016-05-28 ENCOUNTER — Encounter: Payer: Self-pay | Admitting: Internal Medicine

## 2016-06-06 ENCOUNTER — Other Ambulatory Visit: Payer: Self-pay | Admitting: Internal Medicine

## 2016-06-08 ENCOUNTER — Other Ambulatory Visit: Payer: Self-pay

## 2016-07-09 ENCOUNTER — Other Ambulatory Visit: Payer: Self-pay

## 2016-08-07 ENCOUNTER — Other Ambulatory Visit: Payer: Self-pay

## 2016-09-01 DIAGNOSIS — H5203 Hypermetropia, bilateral: Secondary | ICD-10-CM | POA: Diagnosis not present

## 2016-09-04 ENCOUNTER — Other Ambulatory Visit: Payer: Self-pay

## 2016-09-07 ENCOUNTER — Other Ambulatory Visit: Payer: Self-pay | Admitting: Allergy

## 2016-10-05 ENCOUNTER — Other Ambulatory Visit: Payer: Self-pay | Admitting: Allergy

## 2016-11-03 ENCOUNTER — Other Ambulatory Visit: Payer: Self-pay | Admitting: Allergy

## 2016-11-10 ENCOUNTER — Ambulatory Visit (INDEPENDENT_AMBULATORY_CARE_PROVIDER_SITE_OTHER): Payer: 59 | Admitting: Internal Medicine

## 2016-11-10 ENCOUNTER — Encounter: Payer: Self-pay | Admitting: Internal Medicine

## 2016-11-10 VITALS — BP 102/72 | HR 72 | Temp 97.5°F | Resp 16 | Ht 64.5 in | Wt 173.0 lb

## 2016-11-10 DIAGNOSIS — Z1212 Encounter for screening for malignant neoplasm of rectum: Secondary | ICD-10-CM

## 2016-11-10 DIAGNOSIS — Z136 Encounter for screening for cardiovascular disorders: Secondary | ICD-10-CM | POA: Diagnosis not present

## 2016-11-10 DIAGNOSIS — Z0000 Encounter for general adult medical examination without abnormal findings: Secondary | ICD-10-CM

## 2016-11-10 DIAGNOSIS — Z79899 Other long term (current) drug therapy: Secondary | ICD-10-CM

## 2016-11-10 DIAGNOSIS — R030 Elevated blood-pressure reading, without diagnosis of hypertension: Secondary | ICD-10-CM | POA: Diagnosis not present

## 2016-11-10 DIAGNOSIS — J301 Allergic rhinitis due to pollen: Secondary | ICD-10-CM

## 2016-11-10 DIAGNOSIS — Z111 Encounter for screening for respiratory tuberculosis: Secondary | ICD-10-CM | POA: Diagnosis not present

## 2016-11-10 DIAGNOSIS — R7309 Other abnormal glucose: Secondary | ICD-10-CM

## 2016-11-10 DIAGNOSIS — E559 Vitamin D deficiency, unspecified: Secondary | ICD-10-CM

## 2016-11-10 DIAGNOSIS — Z0001 Encounter for general adult medical examination with abnormal findings: Secondary | ICD-10-CM

## 2016-11-10 DIAGNOSIS — E782 Mixed hyperlipidemia: Secondary | ICD-10-CM

## 2016-11-10 DIAGNOSIS — R5383 Other fatigue: Secondary | ICD-10-CM

## 2016-11-10 MED ORDER — MONTELUKAST SODIUM 10 MG PO TABS: ORAL_TABLET | ORAL | 3 refills | Status: DC

## 2016-11-10 NOTE — Progress Notes (Signed)
[facility]   Ricky Myers, M.D.        Ricky Myers, P.A.-[facility] [address] [place], [place]. 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  Patient has been followed expectantl  for labile HTN, Prediabetes, Hyperlipidemia and Vitamin D Deficiency.     Patient's BP has been controlled at home.  Today's BP is at goal - 102/72. Patient denies any cardiac symptoms as chest pain, palpitations, shortness of breath, dizziness or ankle swelling.     Patient's mixed hyperlipidemia is controlled with diet. Patient denies myalgias or other medication SE's. Last lipids were at goal albeit elevated Trig's: Lab Results  Component Value Date   CHOL 158 05/03/2015   HDL 48 05/03/2015   LDLCALC 71 05/03/2015   TRIG 196 (H) 05/03/2015   CHOLHDL 3.3 05/03/2015      Patient has gained 8# over the last year from 165#  is also screened expectantly for prediabetes and patient denies reactive hypoglycemic symptoms, visual blurring, diabetic polys or paresthesias. Last A1c was at goal: Lab Results  Component Value Date   HGBA1C 5.6 05/03/2015       Finally, patient has history of Vitamin D Deficiency ("36" in 2014) and last vitamin D was very low : Lab Results  Component Value Date   VD25OH 35 05/03/2015   Medication Sig  . ACYCLOVIR  Take 1,000 mg by mouth. Patient takes PRN  . finasteride  5 MG take 1 tablet by mouth once daily  . naproxen  500 MG  Take 500 mg by mouth 2 (two) times daily with a meal.  . valACYclovir  1000 MG      No Known Allergies  Past Medical History:  Diagnosis Date  . Asthma    Health Maintenance  Topic Date Due  . HIV Screening   11/17/1994  . INFLUENZA VACCINE  02/17/2017  . TETANUS/TDAP  05/02/2025   Immunization History  Administered Date(s) Administered  . PPD Test 04/02/2014, 05/03/2015, 11/10/2016  . Pneumococcal Polysaccharide-23 06/19/2012  . Tdap 05/03/2015   History reviewed. No pertinent surgical history.  History reviewed. No pertinent family history. (Adopted)  Social History   Social History  . Marital status: Single  . Years of education: N/A   Occupational History  . Deputy Sherriff   Social History Main Topics  . Smoking status: Never Smoker  . Smokeless tobacco: Never Used  . Alcohol use 0.0 oz/week     Comment: 1 to 6 beers weekly  . Drug use: No  . Sexual activity: active    ROS Constitutional: Denies fever, chills, weight loss/gain, headaches, insomnia,  night sweats or change in appetite. Does c/o fatigue. Eyes: Denies redness, blurred vision, diplopia, discharge, itchy or watery eyes.  ENT: Denies discharge, congestion, post nasal drip, epistaxis, sore throat, earache, hearing loss, dental pain, Tinnitus, Vertigo, Sinus pain or snoring.  Cardio: Denies chest pain, palpitations, irregular heartbeat, syncope, dyspnea, diaphoresis, orthopnea, PND, claudication or edema Respiratory: denies cough, dyspnea, DOE, pleurisy, hoarseness, laryngitis or wheezing.  Gastrointestinal: Denies dysphagia, heartburn, reflux, water brash, pain, cramps, nausea, vomiting, bloating, diarrhea, constipation, hematemesis, melena, hematochezia, jaundice or hemorrhoids Genitourinary: Denies dysuria, frequency, urgency, nocturia, hesitancy, discharge, hematuria or flank pain Musculoskeletal: Denies arthralgia, myalgia, stiffness, Jt. Swelling,  pain, limp or strain/sprain. Denies Falls. Skin: Denies puritis, rash, hives, warts, acne, eczema or change in skin lesion Neuro: No weakness, tremor, incoordination, spasms, paresthesia or pain Psychiatric: Denies confusion, memory loss or sensory loss. Denies  Depression. Endocrine: Denies change in weight, skin, hair change, nocturia, and paresthesia, diabetic polys, visual blurring or hyper / hypo glycemic episodes.  Heme/Lymph: No excessive bleeding, bruising or enlarged lymph nodes.  Physical Exam  BP 102/72   Pulse 72   Temp 97.5 F (36.4 C)   Resp 16   Ht 5' 4.5" (1.638 m)   Wt 173 lb (78.5 kg)   BMI 29.24 kg/m   General Appearance: Well nourished and well groomed and in no apparent distress.  Eyes: PERRLA, EOMs, conjunctiva no swelling or erythema, normal fundi and vessels. Sinuses: No frontal/maxillary tenderness ENT/Mouth: EACs patent / TMs  nl. Nares clear without erythema, swelling, mucoid exudates. Oral hygiene is good. No erythema, swelling, or exudate. Tongue normal, non-obstructing. Tonsils not swollen or erythematous. Hearing normal.  Neck: Supple, thyroid normal. No bruits, nodes or JVD. Respiratory: Respiratory effort normal.  BS equal and clear bilateral without rales, rhonci, wheezing or stridor. Cardio: Heart sounds are normal with regular rate and rhythm and no murmurs, rubs or gallops. Peripheral pulses are normal and equal bilaterally without edema. No aortic or femoral bruits. Chest: symmetric with normal excursions and percussion.  Abdomen: Soft, with Nl bowel sounds. Nontender, no guarding, rebound, hernias, masses, or organomegaly.  Lymphatics: Non tender without lymphadenopathy.  Genitourinary: No hernias.Testes nl. DRE - prostate nl for age - smooth & firm w/o nodules. Musculoskeletal: Full ROM all peripheral extremities, joint stability, 5/5 strength, and normal gait. Skin: Warm and dry without rashes, lesions, cyanosis, clubbing or  ecchymosis.  Neuro: Cranial nerves intact, reflexes equal bilaterally. Normal muscle tone, no cerebellar symptoms. Sensation intact.  Pysch: Alert and oriented X 3 with normal affect, insight and judgment appropriate.   Assessment and Plan  1. Annual Preventative/Screening Exam    2. Elevated BP without diagnosis of hypertension  - EKG 12-Lead - Urinalysis, Routine w reflex microscopic - Microalbumin / creatinine urine ratio - CBC with Differential/Platelet - BASIC METABOLIC PANEL WITH GFR - Magnesium - TSH  3. Mixed hyperlipidemia  - EKG 12-Lead - Hepatic function panel - Lipid panel - TSH  4. Other abnormal glucose  - EKG 12-Lead - Hemoglobin A1c - Insulin, random  5. Vitamin D deficiency  - VITAMIN D 25 Hydroxy  6. Screening for rectal cancer  - POC Hemoccult Bld/Stl  7. Screening for ischemic heart disease  - EKG 12-Lead  8. Screening examination for pulmonary tuberculosis   9. Fatigue, unspecified type  - Vitamin B12 - Iron and TIBC - Testosterone - CBC with Differential/Platelet - PPD  10. Medication management  - Urinalysis, Routine w reflex microscopic - Microalbumin / creatinine urine ratio - CBC with Differential/Platelet - BASIC METABOLIC PANEL WITH GFR - Hepatic function panel - Magnesium - Lipid panel - TSH - Hemoglobin A1c - Insulin, random - VITAMIN D 25 Hydroxy  11. Seasonal allergic rhinitis due to pollen       Patient was counseled in prudent diet, weight contro to achieve/maintain BMI less than 25, BP monitoring, regular exercise and medications as discussed.  Discussed med effects and SE's. Routine screening labs and tests as requested with regular follow-up as recommended. Over 40 minutes of exam, counseling, chart review and high complex critical decision making was performed

## 2016-11-10 NOTE — Patient Instructions (Signed)
Preventive Care for Adults  A healthy lifestyle and preventive care can promote health and wellness. Preventive health guidelines for men include the following key practices:  A routine yearly physical is a good way to check with your health care provider about your health and preventative screening. It is a chance to share any concerns and updates on your health and to receive a thorough exam.  Visit your dentist for a routine exam and preventative care every 6 months. Brush your teeth twice a day and floss once a day. Good oral hygiene prevents tooth decay and gum disease.  The frequency of eye exams is based on your age, health, family medical history, use of contact lenses, and other factors. Follow your health care provider's recommendations for frequency of eye exams.  Eat a healthy diet. Foods such as vegetables, fruits, whole grains, low-fat dairy products, and lean protein foods contain the nutrients you need without too many calories. Decrease your intake of foods high in solid fats, added sugars, and salt. Eat the right amount of calories for you.Get information about a proper diet from your health care provider, if necessary.  Regular physical exercise is one of the most important things you can do for your health. Most adults should get at least 150 minutes of moderate-intensity exercise (any activity that increases your heart rate and causes you to sweat) each week. In addition, most adults need muscle-strengthening exercises on 2 or more days a week.  Maintain a healthy weight. The body mass index (BMI) is a screening tool to identify possible weight problems. It provides an estimate of body fat based on height and weight. Your health care provider can find your BMI and can help you achieve or maintain a healthy weight.For adults 20 years and older:  A BMI below 18.5 is considered underweight.  A BMI of 18.5 to 24.9 is normal.  A BMI of 25 to 29.9 is considered overweight.  A  BMI of 30 and above is considered obese.  Maintain normal blood lipids and cholesterol levels by exercising and minimizing your intake of saturated fat. Eat a balanced diet with plenty of fruit and vegetables. Blood tests for lipids and cholesterol should begin at age 20 and be repeated every 5 years. If your lipid or cholesterol levels are high, you are over 50, or you are at high risk for heart disease, you may need your cholesterol levels checked more frequently.Ongoing high lipid and cholesterol levels should be treated with medicines if diet and exercise are not working.  If you smoke, find out from your health care provider how to quit. If you do not use tobacco, do not start.  Lung cancer screening is recommended for adults aged 55-80 years who are at high risk for developing lung cancer because of a history of smoking. A yearly low-dose CT scan of the lungs is recommended for people who have at least a 30-pack-year history of smoking and are a current smoker or have quit within the past 15 years. A pack year of smoking is smoking an average of 1 pack of cigarettes a day for 1 year (for example: 1 pack a day for 30 years or 2 packs a day for 15 years). Yearly screening should continue until the smoker has stopped smoking for at least 15 years. Yearly screening should be stopped for people who develop a health problem that would prevent them from having lung cancer treatment.  If you choose to drink alcohol, do not have more   than 2 drinks per day. One drink is considered to be 12 ounces (355 mL) of beer, 5 ounces (148 mL) of wine, or 1.5 ounces (44 mL) of liquor.  High blood pressure causes heart disease and increases the risk of stroke. Your blood pressure should be checked. Ongoing high blood pressure should be treated with medicines, if weight loss and exercise are not effective.  If you are 38-65 years old, ask your health care provider if you should take aspirin to prevent heart  disease.  Diabetes screening involves taking a blood sample to check your fasting blood sugar level. Testing should be considered at a younger age or be carried out more frequently if you are overweight and have at least 1 risk factor for diabetes.  Colorectal cancer can be detected and often prevented. Most routine colorectal cancer screening begins at the age of 56 and continues through age 26. However, your health care provider may recommend screening at an earlier age if you have risk factors for colon cancer. On a yearly basis, your health care provider may provide home test kits to check for hidden blood in the stool. Use of a small camera at the end of a tube to directly examine the colon (sigmoidoscopy or colonoscopy) can detect the earliest forms of colorectal cancer. Talk to your health care provider about this at age 96, when routine screening begins. Direct exam of the colon should be repeated every 5-10 years through age 69, unless early forms of precancerous polyps or small growths are found.  Screening for abdominal aortic aneurysm (AAA)  are recommended for persons over age 25 who have history of hypertensionor who are current or former smokers.  Talk with your health care provider about prostate cancer screening.  Testicular cancer screening is recommended for adult males. Screening includes self-exam, a health care provider exam, and other screening tests. Consult with your health care provider about any symptoms you have or any concerns you have about testicular cancer.  Use sunscreen. Apply sunscreen liberally and repeatedly throughout the day. You should seek shade when your shadow is shorter than you. Protect yourself by wearing long sleeves, pants, a wide-brimmed hat, and sunglasses year round, whenever you are outdoors.  Once a month, do a whole-body skin exam, using a mirror to look at the skin on your back. Tell your health care provider about new moles, moles that have  irregular borders, moles that are larger than a pencil eraser, or moles that have changed in shape or color.  Stay current with required vaccines (immunizations).  Influenza vaccine. All adults should be immunized every year.  Tetanus, diphtheria, and acellular pertussis (Td, Tdap) vaccine. An adult who has not previously received Tdap or who does not know his vaccine status should receive 1 dose of Tdap. This initial dose should be followed by tetanus and diphtheria toxoids (Td) booster doses every 10 years. Adults with an unknown or incomplete history of completing a 3-dose immunization series with Td-containing vaccines should begin or complete a primary immunization series including a Tdap dose. Adults should receive a Td booster every 10 years.  Zoster vaccine. One dose is recommended for adults aged 26 years or older unless certain conditions are present.    Pneumococcal 13-valent conjugate (PCV13) vaccine. When indicated, a person who is uncertain of his immunization history and has no record of immunization should receive the PCV13 vaccine. An adult aged 52 years or older who has certain medical conditions and has not been previously immunized  should receive 1 dose of PCV13 vaccine. This PCV13 should be followed with a dose of pneumococcal polysaccharide (PPSV23) vaccine. The PPSV23 vaccine dose should be obtained at least 8 weeks after the dose of PCV13 vaccine. An adult aged 104 years or older who has certain medical conditions and previously received 1 or more doses of PPSV23 vaccine should receive 1 dose of PCV13. The PCV13 vaccine dose should be obtained 1 or more years after the last PPSV23 vaccine dose.    Pneumococcal polysaccharide (PPSV23) vaccine. When PCV13 is also indicated, PCV13 should be obtained first. All adults aged 71 years and older should be immunized. An adult younger than age 40 years who has certain medical conditions should be immunized. Any person who resides in a  nursing home or long-term care facility should be immunized. An adult smoker should be immunized. People with an immunocompromised condition and certain other conditions should receive both PCV13 and PPSV23 vaccines. People with human immunodeficiency virus (HIV) infection should be immunized as soon as possible after diagnosis. Immunization during chemotherapy or radiation therapy should be avoided. Routine use of PPSV23 vaccine is not recommended for American Indians, [place] Natives, or people younger than 65 years unless there are medical conditions that require PPSV23 vaccine. When indicated, people who have unknown immunization and have no record of immunization should receive PPSV23 vaccine. One-time revaccination 5 years after the first dose of PPSV23 is recommended for people aged 19-64 years who have chronic kidney failure, nephrotic syndrome, asplenia, or immunocompromised conditions. People who received 1-2 doses of PPSV23 before age 82 years should receive another dose of PPSV23 vaccine at age 75 years or later if at least 5 years have passed since the previous dose. Doses of PPSV23 are not needed for people immunized with PPSV23 at or after age 49 years.  Hepatitis A vaccine. Adults who wish to be protected from this disease, have certain high-risk conditions, work with hepatitis A-infected animals, work in hepatitis A research labs, or travel to or work in countries with a high rate of hepatitis A should be immunized. Adults who were previously unvaccinated and who anticipate close contact with an international adoptee during the first 60 days after arrival in the [place] from a country with a high rate of hepatitis A should be immunized.  Hepatitis B vaccine. Adults should be immunized if they wish to be protected from this disease, have certain high-risk conditions, may be exposed to blood or other infectious body fluids, are household contacts or sex partners of hepatitis B positive  people, are clients or workers in certain care facilities, or travel to or work in countries with a high rate of hepatitis B.  Preventive Service / Frequency  Ages 59 to 45  Blood pressure check.  Lipid and cholesterol check.  Hepatitis C blood test.** / For any individual with known risks for hepatitis C.  Skin self-exam. / Monthly.  Influenza vaccine. / Every year.  Tetanus, diphtheria, and acellular pertussis (Tdap, Td) vaccine.** / Consult your health care provider. 1 dose of Td every 10 years.  HPV vaccine. / 3 doses over 6 months, if 51 or younger.  Measles, mumps, rubella (MMR) vaccine.** / You need at least 1 dose of MMR if you were born in 1957 or later. You may also need a second dose.  Pneumococcal 13-valent conjugate (PCV13) vaccine.** / Consult your health care provider.  Pneumococcal polysaccharide (PPSV23) vaccine.** / 1 to 2 doses if you smoke cigarettes or if you have  certain conditions.  Meningococcal vaccine.** / 1 dose if you are age 65 to 56 years and a Market researcher living in a residence hall, or have one of several medical conditions. You may also need additional booster doses.  Hepatitis A vaccine.** / Consult your health care provider.  Hepatitis B vaccine.** / Consult your health care provider. +++++++++ Recommend Adult Low Dose Aspirin or  coated  Aspirin 81 mg daily  To reduce risk of Colon Cancer 20 %,  Skin Cancer 26 % ,  Melanoma 46%  and  Pancreatic cancer 60% ++++++++++++++++++ Vitamin D goal  is between 70-100.  Please make sure that you are taking your Vitamin D as directed.  It is very important as a natural anti-inflammatory  helping hair, skin, and nails, as well as reducing stroke and heart attack risk.  It helps your bones and helps with mood. It also decreases numerous cancer risks so please take it as directed.  Low Vit D is associated with a 200-300% higher risk for CANCER  and 200-300% higher risk for HEART    ATTACK  &  STROKE.   .....................................Marland Kitchen It is also associated with higher death rate at younger ages,  autoimmune diseases like Rheumatoid arthritis, Lupus, Multiple Sclerosis.    Also many other serious conditions, like depression, Alzheimer's Dementia, infertility, muscle aches, fatigue, fibromyalgia - just to name a few. +++++++++++++++++++++ Recommend the book "The END of DIETING" by Dr Excell Seltzer  & the book "The END of DIABETES " by Dr Excell Seltzer At [email or web address] - get book & Audio CD's    Being diabetic has a  300% increased risk for heart attack, stroke, cancer, and alzheimer- type vascular dementia. It is very important that you work harder with diet by avoiding all foods that are white. Avoid white rice (brown & wild rice is OK), white potatoes (sweetpotatoes in moderation is OK), White bread or wheat bread or anything made out of white flour like bagels, donuts, rolls, buns, biscuits, cakes, pastries, cookies, pizza crust, and pasta (made from white flour & egg whites) - vegetarian pasta or spinach or wheat pasta is OK. Multigrain breads like Arnold's or [employer], or multigrain sandwich thins or flatbreads.  Diet, exercise and weight loss can reverse and cure diabetes in the early stages.  Diet, exercise and weight loss is very important in the control and prevention of complications of diabetes which affects every system in your body, ie. Brain - dementia/stroke, eyes - glaucoma/blindness, heart - heart attack/heart failure, kidneys - dialysis, stomach - gastric paralysis, intestines - malabsorption, nerves - severe painful neuritis, circulation - gangrene & loss of a leg(s), and finally cancer and Alzheimers.    I recommend avoid fried & greasy foods,  sweets/candy, white rice (brown or wild rice or Quinoa is OK), white potatoes (sweet potatoes are OK) - anything made from white flour - bagels, doughnuts, rolls, buns, biscuits,white and wheat breads, pizza crust  and traditional pasta made of white flour & egg white(vegetarian pasta or spinach or wheat pasta is OK).  Multi-grain bread is OK - like multi-grain flat bread or sandwich thins. Avoid alcohol in excess. Exercise is also important.    Eat all the vegetables you want - avoid meat, especially red meat and dairy - especially cheese.  Cheese is the most concentrated form of trans-fats which is the worst thing to clog up our arteries. Veggie cheese is OK which can be found in the fresh produce section at [facility] or  [employer] or [employer]  +++++++++++++++++++ DASH Eating Plan  DASH stands for "Dietary Approaches to Stop Hypertension."   The DASH eating plan is a healthy eating plan that has been shown to reduce high blood pressure (hypertension). Additional health benefits may include reducing the risk of type 2 diabetes mellitus, heart disease, and stroke. The DASH eating plan may also help with weight loss. WHAT DO I NEED TO KNOW ABOUT THE DASH EATING PLAN? For the DASH eating plan, you will follow these general guidelines:  Choose foods with a percent daily value for sodium of less than 5% (as listed on the food label).  Use salt-free seasonings or herbs instead of table salt or sea salt.  Check with your health care provider or pharmacist before using salt substitutes.  Eat lower-sodium products, often labeled as "lower sodium" or "no salt added."  Eat fresh foods.  Eat more vegetables, fruits, and low-fat dairy products.  Choose whole grains. Look for the word "whole" as the first word in the ingredient list.  Choose fish   Limit sweets, desserts, sugars, and sugary drinks.  Choose heart-healthy fats.  Eat veggie cheese   Eat more home-cooked food and less restaurant, buffet, and fast food.  Limit fried foods.  Leib foods using methods other than frying.  Limit canned vegetables. If you do use them, rinse them well to decrease the sodium.  When eating at a  restaurant, ask that your food be prepared with less salt, or no salt if possible.                      WHAT FOODS CAN I EAT? Read Dr Fara Olden Fuhrman's books on The End of Dieting & The End of Diabetes  Grains Whole grain or whole wheat bread. Brown rice. Whole grain or whole wheat pasta. Quinoa, bulgur, and whole grain cereals. Low-sodium cereals. Corn or whole wheat flour tortillas. Whole grain cornbread. Whole grain crackers. Low-sodium crackers.  Vegetables Fresh or frozen vegetables (raw, steamed, roasted, or grilled). Low-sodium or reduced-sodium tomato and vegetable juices. Low-sodium or reduced-sodium tomato sauce and paste. Low-sodium or reduced-sodium canned vegetables.   Fruits All fresh, canned (in natural juice), or frozen fruits.  Protein Products  All fish and seafood.  Dried beans, peas, or lentils. Unsalted nuts and seeds. Unsalted canned beans.  Dairy Low-fat dairy products, such as skim or 1% milk, 2% or reduced-fat cheeses, low-fat ricotta or cottage cheese, or plain low-fat yogurt. Low-sodium or reduced-sodium cheeses.  Fats and Oils Tub margarines without trans fats. Light or reduced-fat mayonnaise and salad dressings (reduced sodium). Avocado. Safflower, olive, or canola oils. Natural peanut or almond butter.  Other Unsalted popcorn and pretzels. The items listed above may not be a complete list of recommended foods or beverages. Contact your dietitian for more options.  +++++++++++++++++++  WHAT FOODS ARE NOT RECOMMENDED? Grains/ White flour or wheat flour White bread. White pasta. White rice. Refined cornbread. Bagels and croissants. Crackers that contain trans fat.  Vegetables  Creamed or fried vegetables. Vegetables in a . Regular canned vegetables. Regular canned tomato sauce and paste. Regular tomato and vegetable juices.  Fruits Dried fruits. Canned fruit in light or heavy syrup. Fruit juice.  Meat and Other Protein Products Meat in general - RED  meat & White meat.  Fatty cuts of meat. Ribs, chicken wings, all processed meats as bacon, sausage, bologna, salami, fatback, hot dogs, bratwurst and packaged luncheon meats.  Dairy Whole or 2% milk, cream,  half-and-half, and cream cheese. Whole-fat or sweetened yogurt. Full-fat cheeses or blue cheese. Non-dairy creamers and whipped toppings. Processed cheese, cheese spreads, or cheese curds.  Condiments Onion and garlic salt, seasoned salt, table salt, and sea salt. Canned and packaged gravies. Worcestershire sauce. Tartar sauce. Barbecue sauce. Teriyaki sauce. Soy sauce, including reduced sodium. Steak sauce. Fish sauce. Oyster sauce. Cocktail sauce. Horseradish. Ketchup and mustard. Meat flavorings and tenderizers. Bouillon cubes. Hot sauce. Tabasco sauce. Marinades. Taco seasonings. Relishes.  Fats and Oils Butter, stick margarine, lard, shortening and bacon fat. Coconut, palm kernel, or palm oils. Regular salad dressings.  Pickles and olives. Salted popcorn and pretzels.  The items listed above may not be a complete list of foods and beverages to avoid.    

## 2016-12-12 ENCOUNTER — Other Ambulatory Visit: Payer: Self-pay | Admitting: Internal Medicine

## 2017-04-26 ENCOUNTER — Other Ambulatory Visit: Payer: Self-pay | Admitting: Internal Medicine

## 2017-04-27 DIAGNOSIS — M25572 Pain in left ankle and joints of left foot: Secondary | ICD-10-CM | POA: Diagnosis not present

## 2017-05-25 DIAGNOSIS — M25572 Pain in left ankle and joints of left foot: Secondary | ICD-10-CM | POA: Diagnosis not present

## 2017-05-25 DIAGNOSIS — L309 Dermatitis, unspecified: Secondary | ICD-10-CM | POA: Diagnosis not present

## 2017-06-03 ENCOUNTER — Other Ambulatory Visit: Payer: Self-pay | Admitting: Internal Medicine

## 2017-06-14 DIAGNOSIS — M25572 Pain in left ankle and joints of left foot: Secondary | ICD-10-CM | POA: Diagnosis not present

## 2017-06-28 ENCOUNTER — Encounter: Payer: Self-pay | Admitting: Internal Medicine

## 2017-07-09 ENCOUNTER — Encounter: Payer: Self-pay | Admitting: Internal Medicine

## 2017-09-30 DIAGNOSIS — M2560 Stiffness of unspecified joint, not elsewhere classified: Secondary | ICD-10-CM | POA: Diagnosis not present

## 2017-09-30 DIAGNOSIS — M9903 Segmental and somatic dysfunction of lumbar region: Secondary | ICD-10-CM | POA: Diagnosis not present

## 2017-11-28 ENCOUNTER — Other Ambulatory Visit: Payer: Self-pay | Admitting: Internal Medicine

## 2017-12-08 ENCOUNTER — Encounter: Payer: Self-pay | Admitting: Internal Medicine

## 2017-12-21 ENCOUNTER — Encounter: Payer: Self-pay | Admitting: Pediatrics

## 2017-12-21 ENCOUNTER — Ambulatory Visit (INDEPENDENT_AMBULATORY_CARE_PROVIDER_SITE_OTHER): Payer: 59 | Admitting: Pediatrics

## 2017-12-21 VITALS — BP 108/64 | HR 81 | Temp 97.6°F | Resp 20 | Ht 63.78 in | Wt 167.5 lb

## 2017-12-21 DIAGNOSIS — J3089 Other allergic rhinitis: Secondary | ICD-10-CM | POA: Insufficient documentation

## 2017-12-21 DIAGNOSIS — J4530 Mild persistent asthma, uncomplicated: Secondary | ICD-10-CM | POA: Diagnosis not present

## 2017-12-21 MED ORDER — ALBUTEROL SULFATE HFA 108 (90 BASE) MCG/ACT IN AERS: 2.0000 | INHALATION_SPRAY | RESPIRATORY_TRACT | 1 refills | Status: DC | PRN

## 2017-12-21 MED ORDER — MONTELUKAST SODIUM 10 MG PO TABS: ORAL_TABLET | ORAL | 5 refills | Status: DC

## 2017-12-21 NOTE — Patient Instructions (Addendum)
Environmental control of dust and mold Claritin 10 mg once a day for runny nose or itchy skin Montelukast   10 mg- take 1 tablet once a day to prevent coughing or wheezing Ventolin 2 puffs every 4 hours if needed for wheezing or coughing spells. You may use 2 puffs of Ventolin 5-15 minutes before exercise Prednisone 10 mg-2 tablets twice a day for 3 days, 2 tablets on the fourth day, one tablet on the fifth day to see if it helps your  Breathing and lung function Continue on your other medications Call me if you are not doing well on this treatment plan

## 2017-12-21 NOTE — Progress Notes (Signed)
[address] [place] [address] Dept: [phone]  New Patient Note  Patient ID: Ricky Myers Vonbehren, male    DOB: 1981-02-25  Age: 37 y.o. MRN: [id number] Date of Office Visit: 12/21/2017 Referring provider: Lucky CowboyMcKeown, William, MD [address] [place], [place] [address]    Chief Complaint: Wheezing  HPI Ricky Myers Schlossberg presents for evaluation of coughing and wheezing with strenuous exercise.HE  developed asthma at 37 years of age. He was hospitalized with a pneumothorax in 1998 and had previous pneumothoraces as well as pneumonia prior to that time. He had a full evaluation of recurrent pneumonias, and all the results were negative including alpha-1 antitrypsin, immunoglobulins , cystic fibrosis test. His best FVC was 78% of predicted. His asthma had improved until the past year , when he has noted more shortness of breath with exercise . He has a history of eczema and itchy skin . Claritin 10 mg once a day helps the itchy skin.  Review of Systems  Constitutional: Negative.   HENT:       Nasal congestion at times  Eyes: Negative.   Respiratory:       Asthma since 37 years of age. Symptoms much improved. He was hospitalized once with a pneumothorax in 1998. No pneumothorax since 1998  Cardiovascular: Negative.   Gastrointestinal: Negative.   Genitourinary:       One episode of prostatitis in the past  Musculoskeletal: Negative.   Skin:       History of eczema and an itchy skin helped with Claritin  Neurological: Negative.   Endo/Heme/Allergies:       No diabetes or thyroid disease  Psychiatric/Behavioral: Negative.     Outpatient Encounter Medications as of 12/21/2017  Medication Sig  . ACYCLOVIR PO Take 1,000 mg by mouth. Patient takes PRN  . finasteride (PROSCAR) 5 MG tablet take 1 tablet by mouth once daily  . loratadine (CLARITIN) 10 MG tablet Take 10 mg by mouth daily.  . montelukast (SINGULAIR) 10 MG tablet TAKE 1 TABLET BY MOUTH ONCE DAILY FOR ALLERGIES  .  naproxen (NAPROSYN) 500 MG tablet Take 500 mg by mouth 2 (two) times daily with a meal.  . valACYclovir (VALTREX) 1000 MG tablet   . albuterol (VENTOLIN HFA) 108 (90 Base) MCG/ACT inhaler Inhale 2 puffs into the lungs every 4 (four) hours as needed for wheezing or shortness of breath.  . montelukast (SINGULAIR) 10 MG tablet Take 1 tablet once a day for coughing or wheezing.   No facility-administered encounter medications on file as of 12/21/2017.      Drug Allergies:  No Known Allergies  Family History: Mykeal's family history includes Emphysema in his maternal grandmother.. Family history is negative for hay fever, sinus problems, angioedema, eczema, hives, food allergies, cystic fibrosis.  Social and environmental. He has a Nurse, mental healthdog. He is not exposed to cigarette smoking. He has never smoked cigarettes in the past. He is a Emergency planning/management officerpolice officer  Physical Exam: BP 108/64 (BP Location: Left Arm, Patient Position: Sitting, Cuff Size: Normal)   Pulse 81   Temp 97.6 F (36.4 C) (Oral)   Resp 20   Ht 5' 3.78" (1.62 m)   Wt 167 lb 8.8 oz (76 kg)   SpO2 96%   BMI 28.96 kg/m    Physical Exam  Constitutional: He is oriented to person, place, and time. He appears well-developed and well-nourished.  HENT:  Eyes normal. Ears normal. Nose mild swelling of  The nasal turbinates. Pharynx normal.  Neck:  Neck supple. No thyromegaly present.  Cardiovascular:  S1 and S2 normal no murmurs  Pulmonary/Chest:  Clear to percussion and auscultation  Abdominal: Soft. There is no tenderness (no hepatosplenomegaly).  Lymphadenopathy:    He has no cervical adenopathy.  Neurological: He is alert and oriented to person, place, and time.  Skin:  clear  Psychiatric: He has a normal mood and affect. His behavior is normal. Judgment and thought content normal.  Vitals reviewed.   Diagnostics: FVC 2.96 L FEV1 2.28 L. Predicted FVC 4.40 L predicted FEV1 3.56. After albuterol 2 puffs FVC 2.98 L FEV1 2.49 L-this shows  a mild reduction in the forced vital capacity. There is no significant airway obstruction. The FEV1 improved 9% after albuterol  Allergy skin test showed mild reactivity to some molds on intradermal testing   Assessment  Assessment and Plan: 1. Mild persistent asthma without complication   2. Other allergic rhinitis     Meds ordered this encounter  Medications  . montelukast (SINGULAIR) 10 MG tablet    Sig: Take 1 tablet once a day for coughing or wheezing.    Dispense:  34 tablet    Refill:  5    Please keep rx on file. Pt. Will call when needed.  Marland Kitchen albuterol (VENTOLIN HFA) 108 (90 Base) MCG/ACT inhaler    Sig: Inhale 2 puffs into the lungs every 4 (four) hours as needed for wheezing or shortness of breath.    Dispense:  1 Inhaler    Refill:  1    Patient Instructions  Environmental control of dust and mold Claritin 10 mg once a day for runny nose or itchy skin Montelukast   10 mg- take 1 tablet once a day to prevent coughing or wheezing Ventolin 2 puffs every 4 hours if needed for wheezing or coughing spells. You may use 2 puffs of Ventolin 5-15 minutes before exercise Prednisone 10 mg-2 tablets twice a day for 3 days, 2 tablets on the fourth day, one tablet on the fifth day to see if it helps your  Breathing and lung function Continue on your other medications Call me if you are not doing well on this treatment plan    Return in about 2 weeks (around 01/04/2018).   Thank you for the opportunity to care for this patient.  Please do not hesitate to contact me with questions.  Tonette Bihari, M.D.  [facility] [address] [place], [place] [address] [phone]

## 2018-01-03 ENCOUNTER — Ambulatory Visit: Payer: 59 | Admitting: Internal Medicine

## 2018-01-03 ENCOUNTER — Encounter: Payer: Self-pay | Admitting: Internal Medicine

## 2018-01-03 VITALS — BP 126/78 | HR 68 | Temp 97.8°F | Resp 18 | Ht 65.0 in | Wt 166.6 lb

## 2018-01-03 DIAGNOSIS — Z0000 Encounter for general adult medical examination without abnormal findings: Secondary | ICD-10-CM

## 2018-01-03 DIAGNOSIS — R7309 Other abnormal glucose: Secondary | ICD-10-CM | POA: Insufficient documentation

## 2018-01-03 DIAGNOSIS — Z136 Encounter for screening for cardiovascular disorders: Secondary | ICD-10-CM | POA: Diagnosis not present

## 2018-01-03 DIAGNOSIS — R030 Elevated blood-pressure reading, without diagnosis of hypertension: Secondary | ICD-10-CM | POA: Diagnosis not present

## 2018-01-03 DIAGNOSIS — E559 Vitamin D deficiency, unspecified: Secondary | ICD-10-CM

## 2018-01-03 DIAGNOSIS — Z1212 Encounter for screening for malignant neoplasm of rectum: Secondary | ICD-10-CM

## 2018-01-03 DIAGNOSIS — R5383 Other fatigue: Secondary | ICD-10-CM

## 2018-01-03 DIAGNOSIS — E782 Mixed hyperlipidemia: Secondary | ICD-10-CM | POA: Diagnosis not present

## 2018-01-03 DIAGNOSIS — Z1211 Encounter for screening for malignant neoplasm of colon: Secondary | ICD-10-CM

## 2018-01-03 DIAGNOSIS — Z0001 Encounter for general adult medical examination with abnormal findings: Secondary | ICD-10-CM

## 2018-01-03 DIAGNOSIS — Z111 Encounter for screening for respiratory tuberculosis: Secondary | ICD-10-CM

## 2018-01-03 DIAGNOSIS — Z79899 Other long term (current) drug therapy: Secondary | ICD-10-CM

## 2018-01-03 NOTE — Patient Instructions (Signed)
Preventive Care for Adults  A healthy lifestyle and preventive care can promote health and wellness. Preventive health guidelines for men include the following key practices:  A routine yearly physical is a good way to check with your health care provider about your health and preventative screening. It is a chance to share any concerns and updates on your health and to receive a thorough exam.  Visit your dentist for a routine exam and preventative care every 6 months. Brush your teeth twice a day and floss once a day. Good oral hygiene prevents tooth decay and gum disease.  The frequency of eye exams is based on your age, health, family medical history, use of contact lenses, and other factors. Follow your health care provider's recommendations for frequency of eye exams.  Eat a healthy diet. Foods such as vegetables, fruits, whole grains, low-fat dairy products, and lean protein foods contain the nutrients you need without too many calories. Decrease your intake of foods high in solid fats, added sugars, and salt. Eat the right amount of calories for you.Get information about a proper diet from your health care provider, if necessary.  Regular physical exercise is one of the most important things you can do for your health. Most adults should get at least 150 minutes of moderate-intensity exercise (any activity that increases your heart rate and causes you to sweat) each week. In addition, most adults need muscle-strengthening exercises on 2 or more days a week.  Maintain a healthy weight. The body mass index (BMI) is a screening tool to identify possible weight problems. It provides an estimate of body fat based on height and weight. Your health care provider can find your BMI and can help you achieve or maintain a healthy weight.For adults 20 years and older:  A BMI below 18.5 is considered underweight.  A BMI of 18.5 to 24.9 is normal.  A BMI of 25 to 29.9 is considered overweight.  A  BMI of 30 and above is considered obese.  Maintain normal blood lipids and cholesterol levels by exercising and minimizing your intake of saturated fat. Eat a balanced diet with plenty of fruit and vegetables. Blood tests for lipids and cholesterol should begin at age 20 and be repeated every 5 years. If your lipid or cholesterol levels are high, you are over 50, or you are at high risk for heart disease, you may need your cholesterol levels checked more frequently.Ongoing high lipid and cholesterol levels should be treated with medicines if diet and exercise are not working.  If you smoke, find out from your health care provider how to quit. If you do not use tobacco, do not start.  Lung cancer screening is recommended for adults aged 55-80 years who are at high risk for developing lung cancer because of a history of smoking. A yearly low-dose CT scan of the lungs is recommended for people who have at least a 30-pack-year history of smoking and are a current smoker or have quit within the past 15 years. A pack year of smoking is smoking an average of 1 pack of cigarettes a day for 1 year (for example: 1 pack a day for 30 years or 2 packs a day for 15 years). Yearly screening should continue until the smoker has stopped smoking for at least 15 years. Yearly screening should be stopped for people who develop a health problem that would prevent them from having lung cancer treatment.  If you choose to drink alcohol, do not have more   than 2 drinks per day. One drink is considered to be 12 ounces (355 mL) of beer, 5 ounces (148 mL) of wine, or 1.5 ounces (44 mL) of liquor.  High blood pressure causes heart disease and increases the risk of stroke. Your blood pressure should be checked. Ongoing high blood pressure should be treated with medicines, if weight loss and exercise are not effective.  If you are 38-65 years old, ask your health care provider if you should take aspirin to prevent heart  disease.  Diabetes screening involves taking a blood sample to check your fasting blood sugar level. Testing should be considered at a younger age or be carried out more frequently if you are overweight and have at least 1 risk factor for diabetes.  Colorectal cancer can be detected and often prevented. Most routine colorectal cancer screening begins at the age of 56 and continues through age 26. However, your health care provider may recommend screening at an earlier age if you have risk factors for colon cancer. On a yearly basis, your health care provider may provide home test kits to check for hidden blood in the stool. Use of a small camera at the end of a tube to directly examine the colon (sigmoidoscopy or colonoscopy) can detect the earliest forms of colorectal cancer. Talk to your health care provider about this at age 96, when routine screening begins. Direct exam of the colon should be repeated every 5-10 years through age 69, unless early forms of precancerous polyps or small growths are found.  Screening for abdominal aortic aneurysm (AAA)  are recommended for persons over age 25 who have history of hypertensionor who are current or former smokers.  Talk with your health care provider about prostate cancer screening.  Testicular cancer screening is recommended for adult males. Screening includes self-exam, a health care provider exam, and other screening tests. Consult with your health care provider about any symptoms you have or any concerns you have about testicular cancer.  Use sunscreen. Apply sunscreen liberally and repeatedly throughout the day. You should seek shade when your shadow is shorter than you. Protect yourself by wearing long sleeves, pants, a wide-brimmed hat, and sunglasses year round, whenever you are outdoors.  Once a month, do a whole-body skin exam, using a mirror to look at the skin on your back. Tell your health care provider about new moles, moles that have  irregular borders, moles that are larger than a pencil eraser, or moles that have changed in shape or color.  Stay current with required vaccines (immunizations).  Influenza vaccine. All adults should be immunized every year.  Tetanus, diphtheria, and acellular pertussis (Td, Tdap) vaccine. An adult who has not previously received Tdap or who does not know his vaccine status should receive 1 dose of Tdap. This initial dose should be followed by tetanus and diphtheria toxoids (Td) booster doses every 10 years. Adults with an unknown or incomplete history of completing a 3-dose immunization series with Td-containing vaccines should begin or complete a primary immunization series including a Tdap dose. Adults should receive a Td booster every 10 years.  Zoster vaccine. One dose is recommended for adults aged 26 years or older unless certain conditions are present.    Pneumococcal 13-valent conjugate (PCV13) vaccine. When indicated, a person who is uncertain of his immunization history and has no record of immunization should receive the PCV13 vaccine. An adult aged 52 years or older who has certain medical conditions and has not been previously immunized  should receive 1 dose of PCV13 vaccine. This PCV13 should be followed with a dose of pneumococcal polysaccharide (PPSV23) vaccine. The PPSV23 vaccine dose should be obtained at least 8 weeks after the dose of PCV13 vaccine. An adult aged 104 years or older who has certain medical conditions and previously received 1 or more doses of PPSV23 vaccine should receive 1 dose of PCV13. The PCV13 vaccine dose should be obtained 1 or more years after the last PPSV23 vaccine dose.    Pneumococcal polysaccharide (PPSV23) vaccine. When PCV13 is also indicated, PCV13 should be obtained first. All adults aged 71 years and older should be immunized. An adult younger than age 40 years who has certain medical conditions should be immunized. Any person who resides in a  nursing home or long-term care facility should be immunized. An adult smoker should be immunized. People with an immunocompromised condition and certain other conditions should receive both PCV13 and PPSV23 vaccines. People with human immunodeficiency virus (HIV) infection should be immunized as soon as possible after diagnosis. Immunization during chemotherapy or radiation therapy should be avoided. Routine use of PPSV23 vaccine is not recommended for American Indians, [place] Natives, or people younger than 65 years unless there are medical conditions that require PPSV23 vaccine. When indicated, people who have unknown immunization and have no record of immunization should receive PPSV23 vaccine. One-time revaccination 5 years after the first dose of PPSV23 is recommended for people aged 19-64 years who have chronic kidney failure, nephrotic syndrome, asplenia, or immunocompromised conditions. People who received 1-2 doses of PPSV23 before age 82 years should receive another dose of PPSV23 vaccine at age 75 years or later if at least 5 years have passed since the previous dose. Doses of PPSV23 are not needed for people immunized with PPSV23 at or after age 49 years.  Hepatitis A vaccine. Adults who wish to be protected from this disease, have certain high-risk conditions, work with hepatitis A-infected animals, work in hepatitis A research labs, or travel to or work in countries with a high rate of hepatitis A should be immunized. Adults who were previously unvaccinated and who anticipate close contact with an international adoptee during the first 60 days after arrival in the [place] from a country with a high rate of hepatitis A should be immunized.  Hepatitis B vaccine. Adults should be immunized if they wish to be protected from this disease, have certain high-risk conditions, may be exposed to blood or other infectious body fluids, are household contacts or sex partners of hepatitis B positive  people, are clients or workers in certain care facilities, or travel to or work in countries with a high rate of hepatitis B.  Preventive Service / Frequency  Ages 59 to 45  Blood pressure check.  Lipid and cholesterol check.  Hepatitis C blood test.** / For any individual with known risks for hepatitis C.  Skin self-exam. / Monthly.  Influenza vaccine. / Every year.  Tetanus, diphtheria, and acellular pertussis (Tdap, Td) vaccine.** / Consult your health care provider. 1 dose of Td every 10 years.  HPV vaccine. / 3 doses over 6 months, if 51 or younger.  Measles, mumps, rubella (MMR) vaccine.** / You need at least 1 dose of MMR if you were born in 1957 or later. You may also need a second dose.  Pneumococcal 13-valent conjugate (PCV13) vaccine.** / Consult your health care provider.  Pneumococcal polysaccharide (PPSV23) vaccine.** / 1 to 2 doses if you smoke cigarettes or if you have  certain conditions.  Meningococcal vaccine.** / 1 dose if you are age 65 to 56 years and a Market researcher living in a residence hall, or have one of several medical conditions. You may also need additional booster doses.  Hepatitis A vaccine.** / Consult your health care provider.  Hepatitis B vaccine.** / Consult your health care provider. +++++++++ Recommend Adult Low Dose Aspirin or  coated  Aspirin 81 mg daily  To reduce risk of Colon Cancer 20 %,  Skin Cancer 26 % ,  Melanoma 46%  and  Pancreatic cancer 60% ++++++++++++++++++ Vitamin D goal  is between 70-100.  Please make sure that you are taking your Vitamin D as directed.  It is very important as a natural anti-inflammatory  helping hair, skin, and nails, as well as reducing stroke and heart attack risk.  It helps your bones and helps with mood. It also decreases numerous cancer risks so please take it as directed.  Low Vit D is associated with a 200-300% higher risk for CANCER  and 200-300% higher risk for HEART    ATTACK  &  STROKE.   .....................................Marland Kitchen It is also associated with higher death rate at younger ages,  autoimmune diseases like Rheumatoid arthritis, Lupus, Multiple Sclerosis.    Also many other serious conditions, like depression, Alzheimer's Dementia, infertility, muscle aches, fatigue, fibromyalgia - just to name a few. +++++++++++++++++++++ Recommend the book "The END of DIETING" by Dr Excell Seltzer  & the book "The END of DIABETES " by Dr Excell Seltzer At [email or web address] - get book & Audio CD's    Being diabetic has a  300% increased risk for heart attack, stroke, cancer, and alzheimer- type vascular dementia. It is very important that you work harder with diet by avoiding all foods that are white. Avoid white rice (brown & wild rice is OK), white potatoes (sweetpotatoes in moderation is OK), White bread or wheat bread or anything made out of white flour like bagels, donuts, rolls, buns, biscuits, cakes, pastries, cookies, pizza crust, and pasta (made from white flour & egg whites) - vegetarian pasta or spinach or wheat pasta is OK. Multigrain breads like Arnold's or Pepper[employer], or multigrain sandwich thins or flatbreads.  Diet, exercise and weight loss can reverse and cure diabetes in the early stages.  Diet, exercise and weight loss is very important in the control and prevention of complications of diabetes which affects every system in your body, ie. Brain - dementia/stroke, eyes - glaucoma/blindness, heart - heart attack/heart failure, kidneys - dialysis, stomach - gastric paralysis, intestines - malabsorption, nerves - severe painful neuritis, circulation - gangrene & loss of a leg(s), and finally cancer and Alzheimers.    I recommend avoid fried & greasy foods,  sweets/candy, white rice (brown or wild rice or Quinoa is OK), white potatoes (sweet potatoes are OK) - anything made from white flour - bagels, doughnuts, rolls, buns, biscuits,white and wheat breads, pizza crust  and traditional pasta made of white flour & egg white(vegetarian pasta or spinach or wheat pasta is OK).  Multi-grain bread is OK - like multi-grain flat bread or sandwich thins. Avoid alcohol in excess. Exercise is also important.    Eat all the vegetables you want - avoid meat, especially red meat and dairy - especially cheese.  Cheese is the most concentrated form of trans-fats which is the worst thing to clog up our arteries. Veggie cheese is OK which can be found in the fresh produce section at [facility] or  [employer] or [employer]  +++++++++++++++++++ DASH Eating Plan  DASH stands for "Dietary Approaches to Stop Hypertension."   The DASH eating plan is a healthy eating plan that has been shown to reduce high blood pressure (hypertension). Additional health benefits may include reducing the risk of type 2 diabetes mellitus, heart disease, and stroke. The DASH eating plan may also help with weight loss. WHAT DO I NEED TO KNOW ABOUT THE DASH EATING PLAN? For the DASH eating plan, you will follow these general guidelines:  Choose foods with a percent daily value for sodium of less than 5% (as listed on the food label).  Use salt-free seasonings or herbs instead of table salt or sea salt.  Check with your health care provider or pharmacist before using salt substitutes.  Eat lower-sodium products, often labeled as "lower sodium" or "no salt added."  Eat fresh foods.  Eat more vegetables, fruits, and low-fat dairy products.  Choose whole grains. Look for the word "whole" as the first word in the ingredient list.  Choose fish   Limit sweets, desserts, sugars, and sugary drinks.  Choose heart-healthy fats.  Eat veggie cheese   Eat more home-cooked food and less restaurant, buffet, and fast food.  Limit fried foods.  Bundren foods using methods other than frying.  Limit canned vegetables. If you do use them, rinse them well to decrease the sodium.  When eating at a  restaurant, ask that your food be prepared with less salt, or no salt if possible.                      WHAT FOODS CAN I EAT? Read Dr Fara Olden Fuhrman's books on The End of Dieting & The End of Diabetes  Grains Whole grain or whole wheat bread. Brown rice. Whole grain or whole wheat pasta. Quinoa, bulgur, and whole grain cereals. Low-sodium cereals. Corn or whole wheat flour tortillas. Whole grain cornbread. Whole grain crackers. Low-sodium crackers.  Vegetables Fresh or frozen vegetables (raw, steamed, roasted, or grilled). Low-sodium or reduced-sodium tomato and vegetable juices. Low-sodium or reduced-sodium tomato sauce and paste. Low-sodium or reduced-sodium canned vegetables.   Fruits All fresh, canned (in natural juice), or frozen fruits.  Protein Products  All fish and seafood.  Dried beans, peas, or lentils. Unsalted nuts and seeds. Unsalted canned beans.  Dairy Low-fat dairy products, such as skim or 1% milk, 2% or reduced-fat cheeses, low-fat ricotta or cottage cheese, or plain low-fat yogurt. Low-sodium or reduced-sodium cheeses.  Fats and Oils Tub margarines without trans fats. Light or reduced-fat mayonnaise and salad dressings (reduced sodium). Avocado. Safflower, olive, or canola oils. Natural peanut or almond butter.  Other Unsalted popcorn and pretzels. The items listed above may not be a complete list of recommended foods or beverages. Contact your dietitian for more options.  +++++++++++++++++++  WHAT FOODS ARE NOT RECOMMENDED? Grains/ White flour or wheat flour White bread. White pasta. White rice. Refined cornbread. Bagels and croissants. Crackers that contain trans fat.  Vegetables  Creamed or fried vegetables. Vegetables in a . Regular canned vegetables. Regular canned tomato sauce and paste. Regular tomato and vegetable juices.  Fruits Dried fruits. Canned fruit in light or heavy syrup. Fruit juice.  Meat and Other Protein Products Meat in general - RED  meat & White meat.  Fatty cuts of meat. Ribs, chicken wings, all processed meats as bacon, sausage, bologna, salami, fatback, hot dogs, bratwurst and packaged luncheon meats.  Dairy Whole or 2% milk, cream,  half-and-half, and cream cheese. Whole-fat or sweetened yogurt. Full-fat cheeses or blue cheese. Non-dairy creamers and whipped toppings. Processed cheese, cheese spreads, or cheese curds.  Condiments Onion and garlic salt, seasoned salt, table salt, and sea salt. Canned and packaged gravies. Worcestershire sauce. Tartar sauce. Barbecue sauce. Teriyaki sauce. Soy sauce, including reduced sodium. Steak sauce. Fish sauce. Oyster sauce. Cocktail sauce. Horseradish. Ketchup and mustard. Meat flavorings and tenderizers. Bouillon cubes. Hot sauce. Tabasco sauce. Marinades. Taco seasonings. Relishes.  Fats and Oils Butter, stick margarine, lard, shortening and bacon fat. Coconut, palm kernel, or palm oils. Regular salad dressings.  Pickles and olives. Salted popcorn and pretzels.  The items listed above may not be a complete list of foods and beverages to avoid.    

## 2018-01-03 NOTE — Progress Notes (Signed)
[facility]   Ricky Myers, M.D.     Dyanne Carrel. Steffanie Dunn, P.A.-C Judd Gaudier, DNP [facility]                [address]                [place], [place]. 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  Patient has been followed expectantly for labile HTN, HLD, Prediabetes and Vitamin D Deficiency.     This nice patient is followed expectantly for labile HTN. Patient's BP has been controlled and today's BP is at goal - 126/78. Patient denies any cardiac symptoms as chest pain, palpitations, shortness of breath, dizziness or ankle swelling.     Patient's hyperlipidemia is controlled with diet. Last lipids were at goal albeit elevated Trig's: Lab Results  Component Value Date   CHOL 158 05/03/2015   HDL 48 05/03/2015   LDLCALC 71 05/03/2015   TRIG 196 (H) 05/03/2015   CHOLHDL 3.3 05/03/2015      Patient monitored expectantly for prediabetes and patient denies reactive hypoglycemic symptoms, visual blurring, diabetic polys or paresthesias. Last A1c was Normal & at goal: Lab Results  Component Value Date   HGBA1C 5.6 05/03/2015       Finally, patient has history of Vitamin D Deficiency ("36"/2014)  and last vitamin D was still very low: Lab Results  Component Value Date   VD25OH 35 05/03/2015   Current Outpatient Medications on File Prior to Visit  Medication Sig  . albuterol (VENTOLIN HFA) 108 (90 Base) MCG/ACT inhaler Inhale 2 puffs into the lungs every 4 (four) hours as needed for wheezing or shortness of breath.  . finasteride (PROSCAR) 5 MG tablet take 1 tablet by mouth once daily  . loratadine (CLARITIN) 10 MG tablet Take 10 mg by mouth daily.  .  montelukast (SINGULAIR) 10 MG tablet TAKE 1 TABLET BY MOUTH ONCE DAILY FOR ALLERGIES  . naproxen (NAPROSYN) 500 MG tablet Take 500 mg by mouth 2 (two) times daily with a meal.  . valACYclovir (VALTREX) 1000 MG tablet    No current facility-administered medications on file prior to visit.    No Known Allergies   Past Medical History:  Diagnosis Date  . Asthma    Health Maintenance  Topic Date Due  . HIV Screening  11/17/1994  . INFLUENZA VACCINE  02/17/2018  . TETANUS/TDAP  05/02/2025   Immunization History  Administered Date(s) Administered  . PPD Test 04/02/2014, 05/03/2015, 11/10/2016  . Pneumococcal Polysaccharide-23 06/19/2012  . Tdap 05/03/2015    Past Surgical History:  Procedure Laterality Date  . no past surgery     Family History  Problem Relation Age of Onset  . Emphysema Maternal Grandmother   . Angioedema Neg Hx   . Asthma Neg Hx   . Immunodeficiency Neg Hx   . Eczema Neg Hx   . Urticaria Neg Hx    Social History   Socioeconomic History  . Marital status: Single  Occupational History  . Deputy sheriff  Tobacco Use  . Smoking status: Never Smoker  . Smokeless tobacco: Never Used  Substance and Sexual Activity  . Alcohol use: Yes    Alcohol/week: 0.0 oz    Comment: 1 to 6 beers weekly  .  Drug use: No  . Sexual activity: Active    ROS Constitutional: Denies fever, chills, weight loss/gain, headaches, insomnia,  night sweats or change in appetite. Does c/o fatigue. Eyes: Denies redness, blurred vision, diplopia, discharge, itchy or watery eyes.  ENT: Denies discharge, congestion, post nasal drip, epistaxis, sore throat, earache, hearing loss, dental pain, Tinnitus, Vertigo, Sinus pain or snoring.  Cardio: Denies chest pain, palpitations, irregular heartbeat, syncope, dyspnea, diaphoresis, orthopnea, PND, claudication or edema Respiratory: denies cough, dyspnea, DOE, pleurisy, hoarseness, laryngitis or wheezing.  Gastrointestinal: Denies dysphagia,  heartburn, reflux, water brash, pain, cramps, nausea, vomiting, bloating, diarrhea, constipation, hematemesis, melena, hematochezia, jaundice or hemorrhoids Genitourinary: Denies dysuria, frequency, urgency, nocturia, hesitancy, discharge, hematuria or flank pain Musculoskeletal: Denies arthralgia, myalgia, stiffness, Jt. Swelling, pain, limp or strain/sprain. Denies Falls. Skin: Denies puritis, rash, hives, warts, acne, eczema or change in skin lesion Neuro: No weakness, tremor, incoordination, spasms, paresthesia or pain Psychiatric: Denies confusion, memory loss or sensory loss. Denies Depression. Endocrine: Denies change in weight, skin, hair change, nocturia, and paresthesia, diabetic polys, visual blurring or hyper / hypo glycemic episodes.  Heme/Lymph: No excessive bleeding, bruising or enlarged lymph nodes.  Physical Exam  BP 126/78   Pulse 68   Temp 97.8 F (36.6 C)   Resp 18   Ht 5\' 5"  (1.651 m)   Wt 166 lb 9.6 oz (75.6 kg)   SpO2 97%   BMI 27.72 kg/m   General Appearance: Well nourished and well groomed and in no apparent distress.  Eyes: PERRLA, EOMs, conjunctiva no swelling or erythema, normal fundi and vessels. Sinuses: No frontal/maxillary tenderness ENT/Mouth: EACs patent / TMs  nl. Nares clear without erythema, swelling, mucoid exudates. Oral hygiene is good. No erythema, swelling, or exudate. Tongue normal, non-obstructing. Tonsils not swollen or erythematous. Hearing normal.  Neck: Supple, thyroid not palpable. No bruits, nodes or JVD. Respiratory: Respiratory effort normal.  BS equal and clear bilateral without rales, rhonci, wheezing or stridor. Cardio: Heart sounds are normal with regular rate and rhythm and no murmurs, rubs or gallops. Peripheral pulses are normal and equal bilaterally without edema. No aortic or femoral bruits. Chest: symmetric with normal excursions and percussion.  Abdomen: Soft, with Nl bowel sounds. Nontender, no guarding, rebound, hernias,  masses, or organomegaly.  Lymphatics: Non tender without lymphadenopathy.  Genitourinary: No hernias.Testes nl. DRE - prostate nl for age - smooth & firm w/o nodules. Musculoskeletal: Full ROM all peripheral extremities, joint stability, 5/5 strength, and normal gait. Skin: Warm and dry without rashes, lesions, cyanosis, clubbing or  ecchymosis.  Neuro: Cranial nerves intact, reflexes equal bilaterally. Normal muscle tone, no cerebellar symptoms. Sensation intact.  Pysch: Alert and oriented X 3 with normal affect, insight and judgment appropriate.   Assessment and Plan  1. Annual Preventative/Screening Exam   2. Elevated BP without diagnosis of hypertension  - EKG 12-Lead - Urinalysis, Routine w reflex microscopic - Microalbumin / creatinine urine ratio - CBC with Differential/Platelet - COMPLETE METABOLIC PANEL WITH GFR - Magnesium - TSH  3. Hyperlipidemia, mixed  - EKG 12-Lead - Lipid panel - TSH  4. Abnormal glucose  - Hemoglobin A1c - Insulin, random  5. Vitamin D deficiency  - VITAMIN D 25 Hydroxyl  6. Screening for colorectal cancer  - POC Hemoccult Bld/Stl  7. Screening for ischemic heart disease  - EKG 12-Lead - Lipid panel  8. Screening examination for pulmonary tuberculosis  - TB Skin Test; Future  9. Fatigue  - Iron,Total/Total Iron Binding Cap - Vitamin  B12 - Testosterone - CBC with Differential/Platelet - TSH  10. Medication management  - Urinalysis, Routine w reflex microscopic - Microalbumin / creatinine urine ratio - CBC with Differential/Platelet        Patient was counseled in prudent diet, weight control to achieve/maintain BMI less than 25, BP monitoring, regular exercise and medications as discussed.  Discussed med effects and SE's. Routine screening labs and tests as requested with regular follow-up as recommended. Over 40 minutes of exam, counseling, chart review and high complex critical decision making was performed

## 2018-02-01 ENCOUNTER — Encounter: Payer: Self-pay | Admitting: Pediatrics

## 2018-02-01 ENCOUNTER — Ambulatory Visit: Payer: 59 | Admitting: Pediatrics

## 2018-02-01 VITALS — BP 112/66 | HR 83 | Temp 97.6°F | Resp 20

## 2018-02-01 DIAGNOSIS — J3089 Other allergic rhinitis: Secondary | ICD-10-CM

## 2018-02-01 DIAGNOSIS — J4530 Mild persistent asthma, uncomplicated: Secondary | ICD-10-CM | POA: Diagnosis not present

## 2018-02-01 NOTE — Progress Notes (Signed)
  [address] [place] [address] Dept: [phone]  FOLLOW UP NOTE  Patient ID: Ricky Myers, male    DOB: [birth year]  Age: 37 y.o. MRN: [id number] Date of Office Visit: 02/01/2018  Assessment  Chief Complaint: Asthma  HPI Ricky CohoDavid W Egler presents for for follow-up of asthma and allergic rhinitis.  His asthma is well controlled.  He is not having any shortness of breath with exercise.  He is on montelukast 10 mg once a day and Claritin 10 mg once a day .  He is not having to use Ventolin as a rescue, but  he likes to use 2 puffs before he goes to bed because it makes him feel better   Drug Allergies:  No Known Allergies  Physical Exam: BP 112/66   Pulse 83   Temp 97.6 F (36.4 C) (Oral)   Resp 20   SpO2 95%    Physical Exam  Constitutional: He is oriented to person, place, and time. He appears well-developed and well-nourished.  HENT:  Eyes normal  Ears normal.  Nose normal.  Pharynx normal.  Neck: Neck supple.  Cardiovascular:  S1 S2 normal No murmurs  Pulmonary/Chest:  Clear to percussion and auscultation  Lymphadenopathy:    He has no cervical adenopathy.  Neurological: He is alert and oriented to person, place, and time.  Psychiatric: He has a normal mood and affect. His behavior is normal. Judgment and thought content normal.  Vitals reviewed.   Diagnostics: FVC 3.29 L FEV1 2.60 L.  Predicted FVC 4.40 L predicted FEV1 3.56 L- this shows a mild reduction in the forced vital capacity which is stable for him  Assessment and Plan: 1. Mild persistent asthma without complication   2. Other allergic rhinitis     No orders of the defined types were placed in this encounter.   Patient Instructions  Claritin 10 mg once a day for runny nose or itching Montelukast 10 mg once a day to prevent coughing or wheezing  Ventolin 2 puffs every 4 hours if needed for wheezing or coughing spells.  He may use Ventolin 2 puffs 5 to 15 minutes before exercise  Call us if you  are not doing well on this treatment plan   Return in about 6 months (around 08/04/2018).    Thank you for the opportunity to care for this patient.  Please do not hesitate to contact me with questions.  Tonette BihariJ. A. Brandelyn Henne, M.D.  [facility] [address] [place], [place] [address] [phone]

## 2018-02-01 NOTE — Patient Instructions (Signed)
Claritin 10 mg once a day for runny nose or itching Montelukast 10 mg once a day to prevent coughing or wheezing  Ventolin 2 puffs every 4 hours if needed for wheezing or coughing spells.  He may use Ventolin 2 puffs 5 to 15 minutes before exercise  Call us if you are not doing well on this treatment plan

## 2018-02-07 DIAGNOSIS — M25511 Pain in right shoulder: Secondary | ICD-10-CM | POA: Diagnosis not present

## 2018-04-18 DIAGNOSIS — J019 Acute sinusitis, unspecified: Secondary | ICD-10-CM | POA: Diagnosis not present

## 2018-04-19 LAB — CBC WITH DIFFERENTIAL/PLATELET
BASOS ABS: 57 {cells}/uL (ref 0–200)
Basophils Relative: 0.7 %
EOS PCT: 2.9 %
Eosinophils Absolute: 235 cells/uL (ref 15–500)
HEMATOCRIT: 42.4 % (ref 38.5–50.0)
Hemoglobin: 15.2 g/dL (ref 13.2–17.1)
LYMPHS ABS: 2106 {cells}/uL (ref 850–3900)
MCH: 33.2 pg — ABNORMAL HIGH (ref 27.0–33.0)
MCHC: 35.8 g/dL (ref 32.0–36.0)
MCV: 92.6 fL (ref 80.0–100.0)
MPV: 9.3 fL (ref 7.5–12.5)
Monocytes Relative: 9 %
NEUTROS PCT: 61.4 %
Neutro Abs: 4973 cells/uL (ref 1500–7800)
Platelets: 210 10*3/uL (ref 140–400)
RBC: 4.58 10*6/uL (ref 4.20–5.80)
RDW: 13 % (ref 11.0–15.0)
Total Lymphocyte: 26 %
WBC mixed population: 729 cells/uL (ref 200–950)
WBC: 8.1 10*3/uL (ref 3.8–10.8)

## 2018-04-19 LAB — COMPLETE METABOLIC PANEL WITH GFR
AG Ratio: 2.4 (calc) (ref 1.0–2.5)
ALKALINE PHOSPHATASE (APISO): 57 U/L (ref 40–115)
ALT: 20 U/L (ref 9–46)
AST: 21 U/L (ref 10–40)
Albumin: 4.7 g/dL (ref 3.6–5.1)
BUN/Creatinine Ratio: 24 (calc) — ABNORMAL HIGH (ref 6–22)
BUN: 30 mg/dL — ABNORMAL HIGH (ref 7–25)
CO2: 28 mmol/L (ref 20–32)
CREATININE: 1.25 mg/dL (ref 0.60–1.35)
Calcium: 9.5 mg/dL (ref 8.6–10.3)
Chloride: 104 mmol/L (ref 98–110)
GFR, Est African American: 84 mL/min/{1.73_m2} (ref >=60)
GFR, Est Non African American: 73 mL/min/{1.73_m2} (ref >=60)
GLOBULIN: 2 g/dL (ref 1.9–3.7)
Glucose, Bld: 83 mg/dL (ref 65–99)
POTASSIUM: 4.9 mmol/L (ref 3.5–5.3)
SODIUM: 139 mmol/L (ref 135–146)
Total Bilirubin: 0.5 mg/dL (ref 0.2–1.2)
Total Protein: 6.7 g/dL (ref 6.1–8.1)

## 2018-04-19 LAB — MAGNESIUM: MAGNESIUM: 2.2 mg/dL (ref 1.5–2.5)

## 2018-04-19 LAB — URINALYSIS, ROUTINE W REFLEX MICROSCOPIC
Bilirubin Urine: NEGATIVE
Glucose, UA: NEGATIVE
HGB URINE DIPSTICK: NEGATIVE
KETONES UR: NEGATIVE
LEUKOCYTES UA: NEGATIVE
Nitrite: NEGATIVE
PH: 6.5 (ref 5.0–8.0)
Protein, ur: NEGATIVE
Specific Gravity, Urine: 1.012 (ref 1.001–1.03)

## 2018-04-19 LAB — HEMOGLOBIN A1C
Hgb A1c MFr Bld: 5.2 % of total Hgb (ref <5.7)
MEAN PLASMA GLUCOSE: 103 (calc)
eAG (mmol/L): 5.7 (calc)

## 2018-04-19 LAB — IRON, TOTAL/TOTAL IRON BINDING CAP
%SAT: 41 % (calc) (ref 20–48)
Iron: 109 ug/dL (ref 50–180)
TIBC: 268 mcg/dL (calc) (ref 250–425)

## 2018-04-19 LAB — TESTOSTERONE: TESTOSTERONE: 527 ng/dL (ref 250–827)

## 2018-04-19 LAB — TSH: TSH: 3.74 mIU/L (ref 0.40–4.50)

## 2018-04-19 LAB — LIPID PANEL
CHOL/HDL RATIO: 3.2 (calc) (ref <5.0)
CHOLESTEROL: 161 mg/dL (ref <200)
HDL: 51 mg/dL (ref >40)
LDL CHOLESTEROL (CALC): 84 mg/dL
Non-HDL Cholesterol (Calc): 110 mg/dL (calc) (ref <130)
Triglycerides: 158 mg/dL — ABNORMAL HIGH (ref <150)

## 2018-04-19 LAB — INSULIN, RANDOM: INSULIN: 8.9 u[IU]/mL (ref 2.0–19.6)

## 2018-04-19 LAB — MICROALBUMIN / CREATININE URINE RATIO: Creatinine, Urine: 30 mg/dL (ref 20–320)

## 2018-04-19 LAB — VITAMIN D 25 HYDROXY (VIT D DEFICIENCY, FRACTURES): VIT D 25 HYDROXY: 45 ng/mL (ref 30–100)

## 2018-04-19 LAB — VITAMIN B12: Vitamin B-12: 619 pg/mL (ref 200–1100)

## 2018-04-26 DIAGNOSIS — J069 Acute upper respiratory infection, unspecified: Secondary | ICD-10-CM | POA: Diagnosis not present

## 2018-05-06 ENCOUNTER — Ambulatory Visit: Payer: 59 | Admitting: Adult Health

## 2018-05-06 ENCOUNTER — Encounter: Payer: Self-pay | Admitting: Adult Health

## 2018-05-06 VITALS — BP 108/66 | HR 80 | Temp 97.5°F | Wt 171.0 lb

## 2018-05-06 DIAGNOSIS — R05 Cough: Secondary | ICD-10-CM | POA: Diagnosis not present

## 2018-05-06 DIAGNOSIS — R058 Other specified cough: Secondary | ICD-10-CM

## 2018-05-06 MED ORDER — PROMETHAZINE-DM 6.25-15 MG/5ML PO SYRP: 5.0000 mL | ORAL_SOLUTION | Freq: Four times a day (QID) | ORAL | 1 refills | Status: DC | PRN

## 2018-05-06 MED ORDER — PREDNISONE 20 MG PO TABS: ORAL_TABLET | ORAL | 0 refills | Status: DC

## 2018-05-06 NOTE — Progress Notes (Signed)
Assessment and Plan:  Ricky Myers was seen today for cough.  Diagnoses and all orders for this visit:  Post-viral cough syndrome Discussed the importance of avoiding unnecessary antibiotic therapy. Suggested symptomatic OTC remedies. Nasal saline spray for congestion. Nasal steroids, allergy pill, oral steroids offered Follow up as needed. -     predniSONE (DELTASONE) 20 MG tablet; 2 tablets daily for 3 days, 1 tablet daily for 4 days. -     promethazine-dextromethorphan (PROMETHAZINE-DM) 6.25-15 MG/5ML syrup; Take 5 mLs by mouth 4 (four) times daily as needed for cough.  Further disposition pending results of labs. Discussed med's effects and SE's.   Over 15 minutes of exam, counseling, chart review, and critical decision making was performed.   Future Appointments  Date Time Provider Department Center  02/07/2019  3:00 PM Lucky Cowboy, MD [id number] None    ------------------------------------------------------------------------------------------------------------------   HPI BP 108/66   Pulse 80   Temp (!) 97.5 F (36.4 C)   Wt 171 lb (77.6 kg)   SpO2 97%   BMI 28.46 kg/m   37 y.o.male hx of asthma, allergic rhinitis presents for URI; he reports he caught a cold 3 weeks ago, saw doc in [place] and was prescribed amoxicillin and other unknown medication, later returned for injectible antibiotic and steroid. He denies headache, nasal congestion, had some facial pressure that has since resolved. Endorses ongoing intermittent dry cough only.  He reports he has ongoing coughing fits when he talks   He endorses some wheezing 1 week ago when he was wearing his vest at work which is unusual for him but has resolved.   Has been taking mucinex - DM which does help some but has difficulty sleeping on this   Singulair and claritin  Past Medical History:  Diagnosis Date  . Asthma      No Known Allergies  Current Outpatient Medications on File Prior to Visit  Medication Sig  .  albuterol (VENTOLIN HFA) 108 (90 Base) MCG/ACT inhaler Inhale 2 puffs into the lungs every 4 (four) hours as needed for wheezing or shortness of breath.  . finasteride (PROSCAR) 5 MG tablet take 1 tablet by mouth once daily  . loratadine (CLARITIN) 10 MG tablet Take 10 mg by mouth daily.  . montelukast (SINGULAIR) 10 MG tablet TAKE 1 TABLET BY MOUTH ONCE DAILY FOR ALLERGIES  . naproxen (NAPROSYN) 500 MG tablet Take 500 mg by mouth as needed.   . valACYclovir (VALTREX) 1000 MG tablet daily.    No current facility-administered medications on file prior to visit.     ROS: Review of Systems  Constitutional: Negative for chills, diaphoresis, fever and malaise/fatigue.  HENT: Negative for congestion, ear discharge, ear pain, hearing loss, sinus pain, sore throat and tinnitus.   Eyes: Negative for blurred vision, pain, discharge and redness.  Respiratory: Positive for cough. Negative for hemoptysis, sputum production, shortness of breath, wheezing and stridor.   Cardiovascular: Negative for chest pain, palpitations and orthopnea.  Gastrointestinal: Negative for abdominal pain, diarrhea, nausea and vomiting.  Genitourinary: Negative.   Musculoskeletal: Negative for joint pain and myalgias.  Skin: Negative for rash.  Neurological: Negative for dizziness, sensory change, weakness and headaches.  Endo/Heme/Allergies: Negative for environmental allergies.  Psychiatric/Behavioral: Negative.   All other systems reviewed and are negative.   Physical Exam:  BP 108/66   Pulse 80   Temp (!) 97.5 F (36.4 C)   Wt 171 lb (77.6 kg)   SpO2 97%   BMI 28.46 kg/m   General Appearance:  Well nourished, in no apparent distress. Eyes: PERRLA, EOMs, conjunctiva no swelling or erythema Sinuses: No Frontal/maxillary tenderness ENT/Mouth: Ext aud canals clear, TMs without erythema, bulging. No erythema, swelling, or exudate on post pharynx.  Tonsils not swollen or erythematous. Hearing normal.  Neck: Supple,  thyroid normal.  Respiratory: Respiratory effort normal, BS equal bilaterally without rales, rhonchi, wheezing or stridor.  Cardio: RRR with no MRGs. Brisk peripheral pulses without edema.  Abdomen: Soft, + BS.  Non tender, no guarding, rebound, hernias, masses. Lymphatics: Non tender without lymphadenopathy.  Musculoskeletal: Full ROM, 5/5 strength, normal gait.  Skin: Warm, dry without rashes, lesions, ecchymosis.  Neuro: Cranial nerves intact. Normal muscle tone, no cerebellar symptoms. Sensation intact.  Psych: Awake and oriented X 3, normal affect, Insight and Judgment appropriate.     Ricky Maker, NP 10:34 AM [facility]

## 2018-05-06 NOTE — Patient Instructions (Signed)

## 2018-05-21 ENCOUNTER — Other Ambulatory Visit: Payer: Self-pay | Admitting: Adult Health

## 2018-05-30 DIAGNOSIS — Z23 Encounter for immunization: Secondary | ICD-10-CM | POA: Diagnosis not present

## 2018-06-07 ENCOUNTER — Other Ambulatory Visit: Payer: Self-pay | Admitting: Internal Medicine

## 2018-08-29 ENCOUNTER — Other Ambulatory Visit: Payer: Self-pay | Admitting: Adult Health

## 2018-12-03 ENCOUNTER — Other Ambulatory Visit: Payer: Self-pay | Admitting: Adult Health

## 2019-01-04 ENCOUNTER — Encounter: Payer: Self-pay | Admitting: Internal Medicine

## 2019-02-07 ENCOUNTER — Ambulatory Visit (INDEPENDENT_AMBULATORY_CARE_PROVIDER_SITE_OTHER): Payer: Self-pay | Admitting: Internal Medicine

## 2019-02-07 DIAGNOSIS — Z5329 Procedure and treatment not carried out because of patient's decision for other reasons: Secondary | ICD-10-CM

## 2019-02-07 NOTE — Progress Notes (Signed)
     N  O        S  H  O  W                                                                                                                                                                                               This very nice 38 y.o. single WM presents for a Screening /Preventative Visit & comprehensive evaluation and management of multiple medical co-morbidities.  Patient has been followed for HTN, HLD, Prediabetes and Vitamin D Deficiency.     HTN predates since     . Patient's BP has been controlled at home.  Today's  . Patient denies any cardiac symptoms as chest pain, palpitations, shortness of breath, dizziness or ankle swelling.     Patient's hyperlipidemia is controlled with diet and medications. Patient denies myalgias or other medication SE's. Last lipids were at goal albeit elevated Trig's: Lab Results  Component Value Date   CHOL 161 01/03/2018   HDL 51 01/03/2018   LDLCALC 84 01/03/2018   TRIG 158 (H) 01/03/2018   CHOLHDL 3.2 01/03/2018      Patient has hx/o prediabetes since    and patient denies reactive hypoglycemic symptoms, visual blurring, diabetic polys or paresthesias. Last A1c was at goal: Lab Results  Component Value Date   HGBA1C 5.2 01/03/2018       Finally, patient has history of Vitamin D Deficiency of    and last vitamin D was still low: Lab Results  Component Value Date   VD25OH 45 01/03/2018

## 2019-04-17 ENCOUNTER — Encounter: Payer: Self-pay | Admitting: Internal Medicine

## 2019-04-17 NOTE — Progress Notes (Signed)
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  Patient has been followed expectantly for labile elevated BP, lipids, abnormal glucose and Vitamin D Deficiency.     Patient also c/o intermittent LBP what he feels is disabling. He has seen a chiropractor & desire to have an orthopedic evaluation.      Patient's BP has been controlled at home.  Today's BP is at goal - 120/80. Patient denies any cardiac symptoms as chest pain, palpitations, shortness of breath, dizziness or ankle swelling.     Patient's hyperlipidemia is controlled with diet and medications. Patient denies myalgias or other medication SE's. Last lipids were at goal albeit slightly elevated Trig's:  Lab Results  Component Value Date   CHOL 161 01/03/2018   HDL 51 01/03/2018   LDLCALC 84 01/03/2018   TRIG 158 (H) 01/03/2018   CHOLHDL 3.2 01/03/2018      Patient is screened expectantly for glucose intolerance and patient denies reactive hypoglycemic symptoms, visual blurring, diabetic polys or paresthesias. Last A1c was Normal & at goal: Lab Results  Component Value Date   HGBA1C 5.2 01/03/2018       Finally, patient has history of Vitamin D Deficiency ("36" / 2014)  and last vitamin D was still low:  Lab Results  Component Value Date   VD25OH 45 01/03/2018   Current Outpatient Medications on File Prior to Visit  Medication Sig  . albuterol (VENTOLIN HFA) 108 (90 Base) MCG/ACT inhaler Inhale 2 puffs into the lungs every 4 (four) hours as needed for wheezing or shortness of breath.  Marland Kitchen aspirin 325 MG EC tablet Take 325 mg by mouth. Takes 2 tablet daily, if needed for back pain.  Marland Kitchen aspirin EC 81 MG tablet Take 81 mg by mouth daily.  . finasteride (PROSCAR) 5 MG tablet Take 1 tablet Daily  . loratadine (CLARITIN) 10 MG  tablet Take 10 mg by mouth daily.  . montelukast (SINGULAIR) 10 MG tablet Take 1 tablet Daily for Allergies  . valACYclovir (VALTREX) 1000 MG tablet daily.    No current facility-administered medications on file prior to visit.    No Known Allergies   Past Medical History:  Diagnosis Date  . Asthma    Health Maintenance  Topic Date Due  . HIV Screening  11/17/1994  . INFLUENZA VACCINE  02/18/2019  . TETANUS/TDAP  05/02/2025   Immunization History  Administered Date(s) Administered  . Influenza Inj Mdck Quad Pf 05/30/2018  . PPD Test 04/02/2014, 05/03/2015, 11/10/2016  . Pneumococcal Polysaccharide-23 06/19/2012  . Tdap 05/03/2015   Past Surgical History:  Procedure Laterality Date  . no past surgery     Family History  Problem Relation Age of Onset  . Emphysema Maternal Grandmother   . Angioedema Neg Hx   . Asthma Neg Hx   . Immunodeficiency Neg Hx   . Eczema Neg Hx   . Urticaria Neg Hx    Social History   Socioeconomic History  . Marital status: Single    Spouse name: Not on file  . Number of children: Not on file  . Years of education: Not on file  . Highest education level: Not on file  Occupational History  . Deputy Sheriff  Tobacco Use  . Smoking status: Never Smoker  . Smokeless tobacco: Never Used  Substance and Sexual Activity  .  Alcohol use: Yes    Comment: 1 to 6 beers weekly  . Drug use: No  . Sexual activity: Active    ROS Constitutional: Denies fever, chills, weight loss/gain, headaches, insomnia,  night sweats or change in appetite. Does c/o fatigue. Eyes: Denies redness, blurred vision, diplopia, discharge, itchy or watery eyes.  ENT: Denies discharge, congestion, post nasal drip, epistaxis, sore throat, earache, hearing loss, dental pain, Tinnitus, Vertigo, Sinus pain or snoring.  Cardio: Denies chest pain, palpitations, irregular heartbeat, syncope, dyspnea, diaphoresis, orthopnea, PND, claudication or edema Respiratory: denies cough,  dyspnea, DOE, pleurisy, hoarseness, laryngitis or wheezing.  Gastrointestinal: Denies dysphagia, heartburn, reflux, water brash, pain, cramps, nausea, vomiting, bloating, diarrhea, constipation, hematemesis, melena, hematochezia, jaundice or hemorrhoids Genitourinary: Denies dysuria, frequency, urgency, nocturia, hesitancy, discharge, hematuria or flank pain Musculoskeletal: Denies arthralgia, myalgia, stiffness, Jt. Swelling, pain, limp or strain/sprain. Denies Falls. Skin: Denies puritis, rash, hives, warts, acne, eczema or change in skin lesion Neuro: No weakness, tremor, incoordination, spasms, paresthesia or pain Psychiatric: Denies confusion, memory loss or sensory loss. Denies Depression. Endocrine: Denies change in weight, skin, hair change, nocturia, and paresthesia, diabetic polys, visual blurring or hyper / hypo glycemic episodes.  Heme/Lymph: No excessive bleeding, bruising or enlarged lymph nodes.  Physical Exam  BP 120/80   Pulse 64   Temp (!) 97.3 F (36.3 C)   Resp 16   Ht  (1.651 m)   Wt 172 lb 3.2 oz (78.1 kg)   BMI 28.66 kg/m   General Appearance: Well nourished and well groomed and in no apparent distress.  Eyes: PERRLA, EOMs, conjunctiva no swelling or erythema, normal fundi and vessels. Sinuses: No frontal/maxillary tenderness ENT/Mouth: EACs patent / TMs  nl. Nares clear without erythema, swelling, mucoid exudates. Oral hygiene is good. No erythema, swelling, or exudate. Tongue normal, non-obstructing. Tonsils not swollen or erythematous. Hearing normal.  Neck: Supple, thyroid not palpable. No bruits, nodes or JVD. Respiratory: Respiratory effort normal.  BS equal and clear bilateral without rales, rhonci, wheezing or stridor. Cardio: Heart sounds are normal with regular rate and rhythm and no murmurs, rubs or gallops. Peripheral pulses are normal and equal bilaterally without edema. No aortic or femoral bruits. Chest: symmetric with normal excursions and  percussion.  Abdomen: Soft, with Nl bowel sounds. Nontender, no guarding, rebound, hernias, masses, or organomegaly.  Lymphatics: Non tender without lymphadenopathy.  Musculoskeletal: Full ROM all peripheral extremities, joint stability, 5/5 strength, and normal gait. Skin: Warm and dry without rashes, lesions, cyanosis, clubbing or  ecchymosis.  Neuro: Cranial nerves intact, reflexes equal bilaterally. Normal muscle tone, no cerebellar symptoms. Sensation intact.  Pysch: Alert and oriented X 3 with normal affect, insight and judgment appropriate.   Assessment and Plan  1. Annual Preventative/Screening Exam   2. Elevated BP without diagnosis of hypertension  - Urinalysis, Routine w reflex microscopic - Microalbumin / creatinine urine ratio - CBC with Differential/Platelet - COMPLETE METABOLIC PANEL WITH GFR - Magnesium - TSH  3. Hyperlipidemia, mixed  - Lipid panel - TSH  4. Abnormal glucose  - Hemoglobin A1c - Insulin, random  5. Vitamin D deficiency  - VITAMIN D 25 Hydroxyl  6. Screening for colorectal cancer  - POC Hemoccult Bld/Stl  7. Screening examination for pulmonary tuberculosis  - TB Skin Test  8. Fatigue  - Iron,Total/Total Iron Binding Cap - Vitamin B12 - Testosterone - CBC with Differential/Platelet - TSH  9. Medication management  - Urinalysis, Routine w reflex microscopic - Microalbumin / creatinine urine ratio -  CBC with Differential/Platelet - COMPLETE METABOLIC PANEL WITH GFR - Magnesium - Lipid panel - TSH - Hemoglobin A1c - Insulin, random - VITAMIN D 25 Hydroxyl       Patient was counseled in prudent diet, weight control to achieve/maintain BMI less than 25, BP monitoring, regular exercise and medications as discussed.  Discussed med effects and SE's. Routine screening labs and tests as requested with regular follow-up as recommended. Over 40 minutes of exam, counseling, chart review and high complex critical decision making was  performed   Kirtland Bouchard, MD

## 2019-04-17 NOTE — Patient Instructions (Signed)
Vit D  & Vit C 1,000 mg   are recommended to help protect  against the Covid-19 and other Corona viruses.    Also it's recommended  to take  Zinc 50 mg  to help  protect against the Covid-19   and best place to get  is also on [email or web address]  and don't pay more than 6-8 cents /pill !  ================================= Coronavirus (COVID-19) Are you at risk?  Are you at risk for the Coronavirus (COVID-19)?  To be considered HIGH RISK for Coronavirus (COVID-19), you have to meet the following criteria:  . Traveled to [place], [place], [place], [place] or [place]; or in the [place] to [place], [place], [place]  . or [place]; and have fever, cough, and shortness of breath within the last 2 weeks of travel OR . Been in close contact with a person diagnosed with COVID-19 within the last 2 weeks and have  . fever, cough,and shortness of breath .  . IF YOU DO NOT MEET THESE CRITERIA, YOU ARE CONSIDERED LOW RISK FOR COVID-19.  What to do if you are HIGH RISK for COVID-19?  Marland Kitchen If you are having a medical emergency, call 911. . Seek medical care right away. Before you go to a doctor's office, urgent care or emergency department, .  call ahead and tell them about your recent travel, contact with someone diagnosed with COVID-19  .  and your symptoms.  . You should receive instructions from your physician's office regarding next steps of care.  . When you arrive at healthcare provider, tell the healthcare staff immediately you have returned from  . visiting [place], [place], [place], [place] or [place]; or traveled in the [place] to [place], [place],  . [place] or [place] in the last two weeks or you have been in close contact with a person diagnosed with  . COVID-19 in the last 2 weeks.   . Tell the health care staff about your symptoms: fever, cough and shortness of breath. . After you have been seen by a medical provider, you will be either: o Tested for (COVID-19)  and discharged home on quarantine except to seek medical care if  o symptoms worsen, and asked to  - Stay home and avoid contact with others until you get your results (4-5 days)  - Avoid travel on public transportation if possible (such as bus, train, or airplane) or o Sent to the Emergency Department by EMS for evaluation, COVID-19 testing  and  o possible admission depending on your condition and test results.  What to do if you are LOW RISK for COVID-19?  Reduce your risk of any infection by using the same precautions used for avoiding the common cold or flu:  Marland Kitchen Wash your hands often with soap and warm water for at least 20 seconds.  If soap and water are not readily available,  . use an alcohol-based hand sanitizer with at least 60% alcohol.  . If coughing or sneezing, cover your mouth and nose by coughing or sneezing into the elbow areas of your shirt or coat, .  into a tissue or into your sleeve (not your hands). . Avoid shaking hands with others and consider head nods or verbal greetings only. . Avoid touching your eyes, nose, or mouth with unwashed hands.  . Avoid close contact with people who are sick. . Avoid places or events with large numbers of people in one location, like concerts or sporting events. Marland Kitchen  Carefully consider travel plans you have or are making. . If you are planning any travel outside or inside the [place], visit the CDC's Travelers' Health webpage for the latest health notices. . If you have some symptoms but not all symptoms, continue to monitor at home and seek medical attention  . if your symptoms worsen. . If you are having a medical emergency, call 911. >>>>>>>>>>>>>>>>>>>>>>>> Preventive Care for Adults  A healthy lifestyle and preventive care can promote health and wellness. Preventive health guidelines for men include the following key practices:  A routine yearly physical is a good way to check with your health care provider about your health and  preventative screening. It is a chance to share any concerns and updates on your health and to receive a thorough exam.  Visit your dentist for a routine exam and preventative care every 6 months. Brush your teeth twice a day and floss once a day. Good oral hygiene prevents tooth decay and gum disease.  The frequency of eye exams is based on your age, health, family medical history, use of contact lenses, and other factors. Follow your health care provider's recommendations for frequency of eye exams.  Eat a healthy diet. Foods such as vegetables, fruits, whole grains, low-fat dairy products, and lean protein foods contain the nutrients you need without too many calories. Decrease your intake of foods high in solid fats, added sugars, and salt. Eat the right amount of calories for you. Get information about a proper diet from your health care provider, if necessary.  Regular physical exercise is one of the most important things you can do for your health. Most adults should get at least 150 minutes of moderate-intensity exercise (any activity that increases your heart rate and causes you to sweat) each week. In addition, most adults need muscle-strengthening exercises on 2 or more days a week.  Maintain a healthy weight. The body mass index (BMI) is a screening tool to identify possible weight problems. It provides an estimate of body fat based on height and weight. Your health care provider can find your BMI and can help you achieve or maintain a healthy weight. For adults 20 years and older:  A BMI below 18.5 is considered underweight.  A BMI of 18.5 to 24.9 is normal.  A BMI of 25 to 29.9 is considered overweight.  A BMI of 30 and above is considered obese.  Maintain normal blood lipids and cholesterol levels by exercising and minimizing your intake of saturated fat. Eat a balanced diet with plenty of fruit and vegetables. Blood tests for lipids and cholesterol should begin at age 62 and be  repeated every 5 years. If your lipid or cholesterol levels are high, you are over 50, or you are at high risk for heart disease, you may need your cholesterol levels checked more frequently. Ongoing high lipid and cholesterol levels should be treated with medicines if diet and exercise are not working.  If you smoke, find out from your health care provider how to quit. If you do not use tobacco, do not start.  Lung cancer screening is recommended for adults aged 75-80 years who are at high risk for developing lung cancer because of a history of smoking. A yearly low-dose CT scan of the lungs is recommended for people who have at least a 30-pack-year history of smoking and are a current smoker or have quit within the past 15 years. A pack year of smoking is smoking an average of 1  pack of cigarettes a day for 1 year (for example: 1 pack a day for 30 years or 2 packs a day for 15 years). Yearly screening should continue until the smoker has stopped smoking for at least 15 years. Yearly screening should be stopped for people who develop a health problem that would prevent them from having lung cancer treatment.  If you choose to drink alcohol, do not have more than 2 drinks per day. One drink is considered to be 12 ounces (355 mL) of beer, 5 ounces (148 mL) of wine, or 1.5 ounces (44 mL) of liquor.  High blood pressure causes heart disease and increases the risk of stroke. Your blood pressure should be checked. Ongoing high blood pressure should be treated with medicines, if weight loss and exercise are not effective.  If you are 45-79 years old, ask your health care provider if you should take aspirin to prevent heart disease.  Diabetes screening involves taking a blood sample to check your fasting blood sugar level. Testing should be considered at a younger age or be carried out more frequently if you are overweight and have at least 1 risk factor for diabetes.  Colorectal cancer can be detected and  often prevented. Most routine colorectal cancer screening begins at the age of 50 and continues through age 75. However, your health care provider may recommend screening at an earlier age if you have risk factors for colon cancer. On a yearly basis, your health care provider may provide home test kits to check for hidden blood in the stool. Use of a small camera at the end of a tube to directly examine the colon (sigmoidoscopy or colonoscopy) can detect the earliest forms of colorectal cancer. Talk to your health care provider about this at age 50, when routine screening begins. Direct exam of the colon should be repeated every 5-10 years through age 75, unless early forms of precancerous polyps or small growths are found.  Screening for abdominal aortic aneurysm (AAA)  are recommended for persons over age 50 who have history of hypertensionor who are current or former smokers.  Talk with your health care provider about prostate cancer screening.  Testicular cancer screening is recommended for adult males. Screening includes self-exam, a health care provider exam, and other screening tests. Consult with your health care provider about any symptoms you have or any concerns you have about testicular cancer.  Use sunscreen. Apply sunscreen liberally and repeatedly throughout the day. You should seek shade when your shadow is shorter than you. Protect yourself by wearing long sleeves, pants, a wide-brimmed hat, and sunglasses year round, whenever you are outdoors.  Once a month, do a whole-body skin exam, using a mirror to look at the skin on your back. Tell your health care provider about new moles, moles that have irregular borders, moles that are larger than a pencil eraser, or moles that have changed in shape or color.  Stay current with required vaccines (immunizations).  Influenza vaccine. All adults should be immunized every year.  Tetanus, diphtheria, and acellular pertussis (Td, Tdap) vaccine.  An adult who has not previously received Tdap or who does not know his vaccine status should receive 1 dose of Tdap. This initial dose should be followed by tetanus and diphtheria toxoids (Td) booster doses every 10 years. Adults with an unknown or incomplete history of completing a 3-dose immunization series with Td-containing vaccines should begin or complete a primary immunization series including a Tdap dose. Adults should receive a   Td booster every 10 years.  Zoster vaccine. One dose is recommended for adults aged 69 years or older unless certain conditions are present.    Pneumococcal 13-valent conjugate (PCV13) vaccine. When indicated, a person who is uncertain of his immunization history and has no record of immunization should receive the PCV13 vaccine. An adult aged 11 years or older who has certain medical conditions and has not been previously immunized should receive 1 dose of PCV13 vaccine. This PCV13 should be followed with a dose of pneumococcal polysaccharide (PPSV23) vaccine. The PPSV23 vaccine dose should be obtained at least 8 weeks after the dose of PCV13 vaccine. An adult aged 41 years or older who has certain medical conditions and previously received 1 or more doses of PPSV23 vaccine should receive 1 dose of PCV13. The PCV13 vaccine dose should be obtained 1 or more years after the last PPSV23 vaccine dose.    Pneumococcal polysaccharide (PPSV23) vaccine. When PCV13 is also indicated, PCV13 should be obtained first. All adults aged 90 years and older should be immunized. An adult younger than age 67 years who has certain medical conditions should be immunized. Any person who resides in a nursing home or long-term care facility should be immunized. An adult smoker should be immunized. People with an immunocompromised condition and certain other conditions should receive both PCV13 and PPSV23 vaccines. People with human immunodeficiency virus (HIV) infection should be immunized as  soon as possible after diagnosis. Immunization during chemotherapy or radiation therapy should be avoided. Routine use of PPSV23 vaccine is not recommended for American Indians, [place] Natives, or people younger than 65 years unless there are medical conditions that require PPSV23 vaccine. When indicated, people who have unknown immunization and have no record of immunization should receive PPSV23 vaccine. One-time revaccination 5 years after the first dose of PPSV23 is recommended for people aged 19-64 years who have chronic kidney failure, nephrotic syndrome, asplenia, or immunocompromised conditions. People who received 1-2 doses of PPSV23 before age 57 years should receive another dose of PPSV23 vaccine at age 29 years or later if at least 5 years have passed since the previous dose. Doses of PPSV23 are not needed for people immunized with PPSV23 at or after age 48 years.  Hepatitis A vaccine. Adults who wish to be protected from this disease, have certain high-risk conditions, work with hepatitis A-infected animals, work in hepatitis A research labs, or travel to or work in countries with a high rate of hepatitis A should be immunized. Adults who were previously unvaccinated and who anticipate close contact with an international adoptee during the first 60 days after arrival in the [place] from a country with a high rate of hepatitis A should be immunized.  Hepatitis B vaccine. Adults should be immunized if they wish to be protected from this disease, have certain high-risk conditions, may be exposed to blood or other infectious body fluids, are household contacts or sex partners of hepatitis B positive people, are clients or workers in certain care facilities, or travel to or work in countries with a high rate of hepatitis B.  Preventive Service / Frequency  Ages 40 to 19  Blood pressure check.  Lipid and cholesterol check.  Hepatitis C blood test.** / For any individual with known risks  for hepatitis C.  Skin self-exam. / Monthly.  Influenza vaccine. / Every year.  Tetanus, diphtheria, and acellular pertussis (Tdap, Td) vaccine.** / Consult your health care provider. 1 dose of Td every 10 years.  HPV vaccine. / 3 doses over 6 months, if 43 or younger.  Measles, mumps, rubella (MMR) vaccine.** / You need at least 1 dose of MMR if you were born in 1957 or later. You may also need a second dose.  Pneumococcal 13-valent conjugate (PCV13) vaccine.** / Consult your health care provider.  Pneumococcal polysaccharide (PPSV23) vaccine.** / 1 to 2 doses if you smoke cigarettes or if you have certain conditions.  Meningococcal vaccine.** / 1 dose if you are age 35 to 96 years and a Market researcher living in a residence hall, or have one of several medical conditions. You may also need additional booster doses.  Hepatitis A vaccine.** / Consult your health care provider.  Hepatitis B vaccine.** / Consult your health care provider. +++++++++ Recommend Adult Low Dose Aspirin or  coated  Aspirin 81 mg daily  To reduce risk of Colon Cancer 40 %,  Skin Cancer 26 % ,  Melanoma 46%  and  Pancreatic cancer 60% ++++++++++++++++++ Vitamin D goal  is between 70-100.  Please make sure that you are taking your Vitamin D as directed.  It is very important as a natural anti-inflammatory  helping hair, skin, and nails, as well as reducing stroke and heart attack risk.  It helps your bones and helps with mood. It also decreases numerous cancer risks so please take it as directed.  Low Vit D is associated with a 200-300% higher risk for CANCER  and 200-300% higher risk for HEART   ATTACK  &  STROKE.   .....................................Marland Kitchen It is also associated with higher death rate at younger ages,  autoimmune diseases like Rheumatoid arthritis, Lupus, Multiple Sclerosis.    Also many other serious conditions, like depression, Alzheimer's Dementia, infertility, muscle  aches, fatigue, fibromyalgia - just to name a few. +++++++++++++++++++++ Recommend the book "The END of DIETING" by Dr Excell Seltzer  & the book "The END of DIABETES " by Dr Excell Seltzer At [facility].com - get book & Audio CD's    Being diabetic has a  300% increased risk for heart attack, stroke, cancer, and alzheimer- type vascular dementia. It is very important that you work harder with diet by avoiding all foods that are white. Avoid white rice (brown & wild rice is OK), white potatoes (sweetpotatoes in moderation is OK), White bread or wheat bread or anything made out of white flour like bagels, donuts, rolls, buns, biscuits, cakes, pastries, cookies, pizza crust, and pasta (made from white flour & egg whites) - vegetarian pasta or spinach or wheat pasta is OK. Multigrain breads like Arnold's or [employer], or multigrain sandwich thins or flatbreads.  Diet, exercise and weight loss can reverse and cure diabetes in the early stages.  Diet, exercise and weight loss is very important in the control and prevention of complications of diabetes which affects every system in your body, ie. Brain - dementia/stroke, eyes - glaucoma/blindness, heart - heart attack/heart failure, kidneys - dialysis, stomach - gastric paralysis, intestines - malabsorption, nerves - severe painful neuritis, circulation - gangrene & loss of a leg(s), and finally cancer and Alzheimers.    I recommend avoid fried & greasy foods,  sweets/candy, white rice (brown or wild rice or Quinoa is OK), white potatoes (sweet potatoes are OK) - anything made from white flour - bagels, doughnuts, rolls, buns, biscuits,white and wheat breads, pizza crust and traditional pasta made of white flour & egg white(vegetarian pasta or spinach or wheat pasta is OK).  Multi-grain bread is OK -  like multi-grain flat bread or sandwich thins. Avoid alcohol in excess. Exercise is also important.    Eat all the vegetables you want - avoid meat, especially red  meat and dairy - especially cheese.  Cheese is the most concentrated form of trans-fats which is the worst thing to clog up our arteries. Veggie cheese is OK which can be found in the fresh produce section at [employer] or [employer] or [employer]  +++++++++++++++++++ DASH Eating Plan  DASH stands for "Dietary Approaches to Stop Hypertension."   The DASH eating plan is a healthy eating plan that has been shown to reduce high blood pressure (hypertension). Additional health benefits may include reducing the risk of type 2 diabetes mellitus, heart disease, and stroke. The DASH eating plan may also help with weight loss. WHAT DO I NEED TO KNOW ABOUT THE DASH EATING PLAN? For the DASH eating plan, you will follow these general guidelines:  Choose foods with a percent daily value for sodium of less than 5% (as listed on the food label).  Use salt-free seasonings or herbs instead of table salt or sea salt.  Check with your health care provider or pharmacist before using salt substitutes.  Eat lower-sodium products, often labeled as "lower sodium" or "no salt added."  Eat fresh foods.  Eat more vegetables, fruits, and low-fat dairy products.  Choose whole grains. Look for the word "whole" as the first word in the ingredient list.  Choose fish   Limit sweets, desserts, sugars, and sugary drinks.  Choose heart-healthy fats.  Eat veggie cheese   Eat more home-cooked food and less restaurant, buffet, and fast food.  Limit fried foods.  [place] foods using methods other than frying.  Limit canned vegetables. If you do use them, rinse them well to decrease the sodium.  When eating at a restaurant, ask that your food be prepared with less salt, or no salt if possible.                      WHAT FOODS CAN I EAT? Read Dr Fara Olden Fuhrman's books on The End of Dieting & The End of Diabetes  Grains Whole grain or whole wheat bread. Brown rice. Whole grain or whole wheat pasta. Quinoa, bulgur,  and whole grain cereals. Low-sodium cereals. Corn or whole wheat flour tortillas. Whole grain cornbread. Whole grain crackers. Low-sodium crackers.  Vegetables Fresh or frozen vegetables (raw, steamed, roasted, or grilled). Low-sodium or reduced-sodium tomato and vegetable juices. Low-sodium or reduced-sodium tomato sauce and paste. Low-sodium or reduced-sodium canned vegetables.   Fruits All fresh, canned (in natural juice), or frozen fruits.  Protein Products  All fish and seafood.  Dried beans, peas, or lentils. Unsalted nuts and seeds. Unsalted canned beans.  Dairy Low-fat dairy products, such as skim or 1% milk, 2% or reduced-fat cheeses, low-fat ricotta or cottage cheese, or plain low-fat yogurt. Low-sodium or reduced-sodium cheeses.  Fats and Oils Tub margarines without trans fats. Light or reduced-fat mayonnaise and salad dressings (reduced sodium). Avocado. Safflower, olive, or canola oils. Natural peanut or almond butter.  Other Unsalted popcorn and pretzels. The items listed above may not be a complete list of recommended foods or beverages. Contact your dietitian for more options.  +++++++++++++++++++  WHAT FOODS ARE NOT RECOMMENDED? Grains/ White flour or wheat flour White bread. White pasta. White rice. Refined cornbread. Bagels and croissants. Crackers that contain trans fat.  Vegetables  Creamed or fried vegetables. Vegetables in a . Regular canned vegetables.  Regular canned tomato sauce and paste. Regular tomato and vegetable juices.  Fruits Dried fruits. Canned fruit in light or heavy syrup. Fruit juice.  Meat and Other Protein Products Meat in general - RED meat & White meat.  Fatty cuts of meat. Ribs, chicken wings, all processed meats as bacon, sausage, bologna, salami, fatback, hot dogs, bratwurst and packaged luncheon meats.  Dairy Whole or 2% milk, cream, half-and-half, and cream cheese. Whole-fat or sweetened yogurt. Full-fat cheeses or blue cheese.  Non-dairy creamers and whipped toppings. Processed cheese, cheese spreads, or cheese curds.  Condiments Onion and garlic salt, seasoned salt, table salt, and sea salt. Canned and packaged gravies. Worcestershire sauce. Tartar sauce. Barbecue sauce. Teriyaki sauce. Soy sauce, including reduced sodium. Steak sauce. Fish sauce. Oyster sauce. Cocktail sauce. Horseradish. Ketchup and mustard. Meat flavorings and tenderizers. Bouillon cubes. Hot sauce. Tabasco sauce. Marinades. Taco seasonings. Relishes.  Fats and Oils Butter, stick margarine, lard, shortening and bacon fat. Coconut, palm kernel, or palm oils. Regular salad dressings.  Pickles and olives. Salted popcorn and pretzels.  The items listed above may not be a complete list of foods and beverages to avoid.

## 2019-04-18 ENCOUNTER — Other Ambulatory Visit: Payer: Self-pay

## 2019-04-18 ENCOUNTER — Ambulatory Visit (INDEPENDENT_AMBULATORY_CARE_PROVIDER_SITE_OTHER): Payer: 59 | Admitting: Internal Medicine

## 2019-04-18 ENCOUNTER — Encounter: Payer: Self-pay | Admitting: Internal Medicine

## 2019-04-18 VITALS — BP 120/80 | HR 64 | Temp 97.3°F | Resp 16 | Ht 65.0 in | Wt 172.2 lb

## 2019-04-18 DIAGNOSIS — R030 Elevated blood-pressure reading, without diagnosis of hypertension: Secondary | ICD-10-CM

## 2019-04-18 DIAGNOSIS — Z79899 Other long term (current) drug therapy: Secondary | ICD-10-CM

## 2019-04-18 DIAGNOSIS — R5383 Other fatigue: Secondary | ICD-10-CM

## 2019-04-18 DIAGNOSIS — Z111 Encounter for screening for respiratory tuberculosis: Secondary | ICD-10-CM | POA: Diagnosis not present

## 2019-04-18 DIAGNOSIS — M545 Low back pain, unspecified: Secondary | ICD-10-CM

## 2019-04-18 DIAGNOSIS — Z1212 Encounter for screening for malignant neoplasm of rectum: Secondary | ICD-10-CM

## 2019-04-18 DIAGNOSIS — Z1211 Encounter for screening for malignant neoplasm of colon: Secondary | ICD-10-CM

## 2019-04-18 DIAGNOSIS — Z0000 Encounter for general adult medical examination without abnormal findings: Secondary | ICD-10-CM

## 2019-04-18 DIAGNOSIS — Z136 Encounter for screening for cardiovascular disorders: Secondary | ICD-10-CM

## 2019-04-18 DIAGNOSIS — E782 Mixed hyperlipidemia: Secondary | ICD-10-CM

## 2019-04-18 DIAGNOSIS — R7309 Other abnormal glucose: Secondary | ICD-10-CM

## 2019-04-18 DIAGNOSIS — Z0001 Encounter for general adult medical examination with abnormal findings: Secondary | ICD-10-CM

## 2019-04-18 DIAGNOSIS — E559 Vitamin D deficiency, unspecified: Secondary | ICD-10-CM

## 2019-04-19 LAB — COMPLETE METABOLIC PANEL WITH GFR
AG Ratio: 2.2 (calc) (ref 1.0–2.5)
ALT: 23 U/L (ref 9–46)
AST: 22 U/L (ref 10–40)
Albumin: 4.3 g/dL (ref 3.6–5.1)
Alkaline phosphatase (APISO): 71 U/L (ref 36–130)
BUN: 16 mg/dL (ref 7–25)
CO2: 25 mmol/L (ref 20–32)
Calcium: 9.2 mg/dL (ref 8.6–10.3)
Chloride: 106 mmol/L (ref 98–110)
Creat: 1.11 mg/dL (ref 0.60–1.35)
GFR, Est African American: 96 mL/min/{1.73_m2} (ref >=60)
GFR, Est Non African American: 83 mL/min/{1.73_m2} (ref >=60)
Globulin: 2 g/dL (calc) (ref 1.9–3.7)
Glucose, Bld: 84 mg/dL (ref 65–99)
Potassium: 4.2 mmol/L (ref 3.5–5.3)
Sodium: 140 mmol/L (ref 135–146)
Total Bilirubin: 0.5 mg/dL (ref 0.2–1.2)
Total Protein: 6.3 g/dL (ref 6.1–8.1)

## 2019-04-19 LAB — URINALYSIS, ROUTINE W REFLEX MICROSCOPIC
Bacteria, UA: NONE SEEN /HPF
Bilirubin Urine: NEGATIVE
Glucose, UA: NEGATIVE
Hgb urine dipstick: NEGATIVE
Hyaline Cast: NONE SEEN /LPF
Ketones, ur: NEGATIVE
Nitrite: NEGATIVE
Protein, ur: NEGATIVE
RBC / HPF: NONE SEEN /HPF (ref 0–2)
Specific Gravity, Urine: 1.007 (ref 1.001–1.03)
Squamous Epithelial / HPF: NONE SEEN /HPF (ref <=5)
pH: 7.5 (ref 5.0–8.0)

## 2019-04-19 LAB — CBC WITH DIFFERENTIAL/PLATELET
Absolute Monocytes: 491 cells/uL (ref 200–950)
Basophils Absolute: 82 cells/uL (ref 0–200)
Basophils Relative: 1.3 %
Eosinophils Absolute: 170 cells/uL (ref 15–500)
Eosinophils Relative: 2.7 %
HCT: 42.4 % (ref 38.5–50.0)
Hemoglobin: 14.9 g/dL (ref 13.2–17.1)
Lymphs Abs: 2048 cells/uL (ref 850–3900)
MCH: 32.6 pg (ref 27.0–33.0)
MCHC: 35.1 g/dL (ref 32.0–36.0)
MCV: 92.8 fL (ref 80.0–100.0)
MPV: 9.2 fL (ref 7.5–12.5)
Monocytes Relative: 7.8 %
Neutro Abs: 3509 cells/uL (ref 1500–7800)
Neutrophils Relative %: 55.7 %
Platelets: 230 10*3/uL (ref 140–400)
RBC: 4.57 10*6/uL (ref 4.20–5.80)
RDW: 13.1 % (ref 11.0–15.0)
Total Lymphocyte: 32.5 %
WBC: 6.3 10*3/uL (ref 3.8–10.8)

## 2019-04-19 LAB — MICROALBUMIN / CREATININE URINE RATIO
Creatinine, Urine: 35 mg/dL (ref 20–320)
Microalb Creat Ratio: 9 mcg/mg creat (ref <30)
Microalb, Ur: 0.3 mg/dL

## 2019-04-19 LAB — INSULIN, RANDOM: Insulin: 16.1 u[IU]/mL

## 2019-04-19 LAB — TESTOSTERONE: Testosterone: 562 ng/dL (ref 250–827)

## 2019-04-19 LAB — VITAMIN D 25 HYDROXY (VIT D DEFICIENCY, FRACTURES): Vit D, 25-Hydroxy: 28 ng/mL — ABNORMAL LOW (ref 30–100)

## 2019-04-19 LAB — IRON, TOTAL/TOTAL IRON BINDING CAP
%SAT: 34 % (calc) (ref 20–48)
Iron: 86 ug/dL (ref 50–180)
TIBC: 254 mcg/dL (calc) (ref 250–425)

## 2019-04-19 LAB — LIPID PANEL
Cholesterol: 178 mg/dL (ref <200)
HDL: 50 mg/dL (ref >=40)
LDL Cholesterol (Calc): 99 mg/dL (calc)
Non-HDL Cholesterol (Calc): 128 mg/dL (calc) (ref <130)
Total CHOL/HDL Ratio: 3.6 (calc) (ref <5.0)
Triglycerides: 191 mg/dL — ABNORMAL HIGH (ref <150)

## 2019-04-19 LAB — HEMOGLOBIN A1C
Hgb A1c MFr Bld: 5.6 % of total Hgb (ref <5.7)
Mean Plasma Glucose: 114 (calc)
eAG (mmol/L): 6.3 (calc)

## 2019-04-19 LAB — MAGNESIUM: Magnesium: 2.1 mg/dL (ref 1.5–2.5)

## 2019-04-19 LAB — TSH: TSH: 3 mIU/L (ref 0.40–4.50)

## 2019-04-19 LAB — VITAMIN B12: Vitamin B-12: 775 pg/mL (ref 200–1100)

## 2019-04-25 LAB — TB SKIN TEST
Induration: 0 mm
TB Skin Test: NEGATIVE

## 2019-04-29 ENCOUNTER — Encounter: Payer: Self-pay | Admitting: Internal Medicine

## 2019-04-29 NOTE — Addendum Note (Signed)
Addended by: Unk Pinto on: 04/29/2019 05:15 PM   Modules accepted: Orders

## 2019-08-10 ENCOUNTER — Other Ambulatory Visit: Payer: Self-pay

## 2019-08-10 ENCOUNTER — Ambulatory Visit: Payer: 59 | Admitting: Physician Assistant

## 2019-08-10 ENCOUNTER — Encounter: Payer: Self-pay | Admitting: Physician Assistant

## 2019-08-10 VITALS — BP 120/82 | HR 73 | Temp 97.3°F | Wt 175.0 lb

## 2019-08-10 DIAGNOSIS — L819 Disorder of pigmentation, unspecified: Secondary | ICD-10-CM

## 2019-08-10 DIAGNOSIS — R5383 Other fatigue: Secondary | ICD-10-CM | POA: Diagnosis not present

## 2019-08-10 MED ORDER — TRETINOIN 0.05 % EX CREA: TOPICAL_CREAM | Freq: Every day | CUTANEOUS | 0 refills | Status: DC

## 2019-08-10 NOTE — Patient Instructions (Addendum)
Do the prescription cream  Follow up with your dermatologist.  I think it is possible that you have sleep apnea. It can cause interrupted sleep, headaches, frequent awakenings, fatigue, dry mouth, fast/slow heart beats, memory issues, anxiety/depression, swelling, numbness tingling hands/feet, weight gain, shortness of breath, and the list goes on. Sleep apnea needs to be ruled out because if it is left untreated it does eventually lead to abnormal heart beats, lung failure or heart failure as well as increasing the risk of heart attack and stroke. There are masks you can wear OR a mouth piece that I can give you information about. Often times though people feel MUCH better after getting treatment.   Sleep Apnea  Sleep apnea is a sleep disorder characterized by abnormal pauses in breathing while you sleep. When your breathing pauses, the level of oxygen in your blood decreases. This causes you to move out of deep sleep and into light sleep. As a result, your quality of sleep is poor, and the system that carries your blood throughout your body (cardiovascular system) experiences stress. If sleep apnea remains untreated, the following conditions can develop:  High blood pressure (hypertension).  Coronary artery disease.  Inability to achieve or maintain an erection (impotence).  Impairment of your thought process (cognitive dysfunction). There are three types of sleep apnea: 1. Obstructive sleep apnea--Pauses in breathing during sleep because of a blocked airway. 2. Central sleep apnea--Pauses in breathing during sleep because the area of the brain that controls your breathing does not send the correct signals to the muscles that control breathing. 3. Mixed sleep apnea--A combination of both obstructive and central sleep apnea.  RISK FACTORS The following risk factors can increase your risk of developing sleep apnea:  Being overweight.  Smoking.  Having narrow passages in your nose and  throat.  Being of older age.  Being male.  Alcohol use.  Sedative and tranquilizer use.  Ethnicity. Among individuals younger than 35 years, African Americans are at increased risk of sleep apnea. SYMPTOMS   Difficulty staying asleep.  Daytime sleepiness and fatigue.  Loss of energy.  Irritability.  Loud, heavy snoring.  Morning headaches.  Trouble concentrating.  Forgetfulness.  Decreased interest in sex. DIAGNOSIS  In order to diagnose sleep apnea, your caregiver will perform a physical examination. Your caregiver may suggest that you take a home sleep test. Your caregiver may also recommend that you spend the night in a sleep lab. In the sleep lab, several monitors record information about your heart, lungs, and brain while you sleep. Your leg and arm movements and blood oxygen level are also recorded. TREATMENT The following actions may help to resolve mild sleep apnea:  Sleeping on your side.   Using a decongestant if you have nasal congestion.   Avoiding the use of depressants, including alcohol, sedatives, and narcotics.   Losing weight and modifying your diet if you are overweight. There also are devices and treatments to help open your airway:  Oral appliances. These are custom-made mouthpieces that shift your lower jaw forward and slightly open your bite. This opens your airway.  Devices that create positive airway pressure. This positive pressure "splints" your airway open to help you breathe better during sleep. The following devices create positive airway pressure:  Continuous positive airway pressure (CPAP) device. The CPAP device creates a continuous level of air pressure with an air pump. The air is delivered to your airway through a mask while you sleep. This continuous pressure keeps your airway open.  Nasal expiratory positive airway pressure (EPAP) device. The EPAP device creates positive air pressure as you exhale. The device consists of  single-use valves, which are inserted into each nostril and held in place by adhesive. The valves create very little resistance when you inhale but create much more resistance when you exhale. That increased resistance creates the positive airway pressure. This positive pressure while you exhale keeps your airway open, making it easier to breath when you inhale again.  Bilevel positive airway pressure (BPAP) device. The BPAP device is used mainly in patients with central sleep apnea. This device is similar to the CPAP device because it also uses an air pump to deliver continuous air pressure through a mask. However, with the BPAP machine, the pressure is set at two different levels. The pressure when you exhale is lower than the pressure when you inhale.  Surgery. Typically, surgery is only done if you cannot comply with less invasive treatments or if the less invasive treatments do not improve your condition. Surgery involves removing excess tissue in your airway to create a wider passage way. Document Released: 06/26/2002 Document Revised: 10/31/2012 Document Reviewed: 11/12/2011 [facility] Patient Information 2015 [place], [place]. This information is not intended to replace advice given to you by your health care provider. Make sure you discuss any questions you have with your health care provider.

## 2019-08-10 NOTE — Progress Notes (Signed)
Subjective:    Patient ID: Ricky Myers, male    DOB: Jul 22, 1980, 39 y.o.   MRN: [id number]  HPI 39 y.o. WM presents for under eye darkness. He works nights for 3 years, has been on rotating shift for 11 years. The last 3 months he has noticed increased darkness under both of his eyes. He has allergies, has switched from zyrtec to claritin and allegra with no effect. He sleeps 6-7 hours a night but is waking up tired. He has gained over 10 lbs recently due to COVID.  GF very rarely sleeps at the same time, however she states he snore occ. He normally sleeps on stomach or right side.  He has bilateral feet pain he states from playing soccer.  He has history of brown macules along hairline, neck/shoulders, that have increased, no itching.   His iron, B12 was normal in 03/2019.   BMI is Body mass index is 29.12 kg/m., he is working on diet and exercise.  Weight is up 10 lbs.  Wt Readings from Last 3 Encounters:  08/10/19 175 lb (79.4 kg)  04/18/19 172 lb 3.2 oz (78.1 kg)  05/06/18 171 lb (77.6 kg)    Blood pressure 120/82, pulse 73, temperature (!) 97.3 F (36.3 C), weight 175 lb (79.4 kg), SpO2 97 %.   Medications    Current Outpatient Medications (Respiratory):  .  albuterol (VENTOLIN HFA) 108 (90 Base) MCG/ACT inhaler, Inhale 2 puffs into the lungs every 4 (four) hours as needed for wheezing or shortness of breath. .  loratadine (CLARITIN) 10 MG tablet, Take 10 mg by mouth daily. .  montelukast (SINGULAIR) 10 MG tablet, Take 1 tablet Daily for Allergies  Current Outpatient Medications (Analgesics):  .  aspirin EC 81 MG tablet, Take 81 mg by mouth daily.   Current Outpatient Medications (Other):  .  finasteride (PROSCAR) 5 MG tablet, Take 1 tablet Daily .  valACYclovir (VALTREX) 1000 MG tablet, daily.   Problem list He has Asthma; Vitamin D deficiency; Medication management; BMI 26.67; Other allergic rhinitis; Elevated BP without diagnosis of hypertension; Hyperlipidemia,  mixed; and Abnormal glucose on their problem list.   Review of Systems  Constitutional: Positive for fatigue. Negative for activity change, appetite change, chills, diaphoresis, fever and unexpected weight change.  HENT: Positive for postnasal drip. Negative for congestion, dental problem, drooling, ear discharge, ear pain, facial swelling, hearing loss, mouth sores, nosebleeds, rhinorrhea, sinus pressure, sinus pain, sneezing and sore throat.   Eyes: Negative for photophobia, pain, discharge, redness, itching and visual disturbance.  Respiratory: Negative.   Cardiovascular: Negative.   Gastrointestinal: Negative.   Genitourinary: Negative.   Musculoskeletal: Negative.   Neurological: Negative.   Psychiatric/Behavioral: Negative.        Objective:   Physical Exam Constitutional:      Appearance: He is well-developed.  HENT:     Head: Normocephalic and atraumatic.     Right Ear: External ear normal.     Left Ear: External ear normal.  Eyes:     General: Lids are normal. Allergic shiner present.     Conjunctiva/sclera: Conjunctivae normal.     Pupils: Pupils are equal, round, and reactive to light.  Cardiovascular:     Rate and Rhythm: Normal rate and regular rhythm.     Heart sounds: Normal heart sounds.  Pulmonary:     Effort: Pulmonary effort is normal.     Breath sounds: Normal breath sounds.  Abdominal:     General: Bowel sounds are normal.  Palpations: Abdomen is soft.  Musculoskeletal:        General: Normal range of motion.     Cervical back: Normal range of motion and neck supple.  Skin:    General: Skin is warm and dry.     Comments: [place] raised macules under bilateral eyes, around hair line, neck, shoulders. No erythema, no scaling, no warmth.   Neurological:     Mental Status: He is alert and oriented to person, place, and time.     Cranial Nerves: No cranial nerve deficit.  Psychiatric:        Behavior: Behavior normal.        Assessment & Plan:    Dark circle under eye, unspecified laterality with fatigue -     CBC with Differential/Platelet -     COMPLETE METABOLIC PANEL WITH GFR -     TSH -     HIV Antibody (routine testing w rflx) -     tretinoin (RETIN-A) 0.05 % cream; Apply topically at bedtime. - ? With recent weight gain is he has OSA, does snore, fatigue in the AM, suggest sleep study, patient would prefer at home sleep study and mouth piece.  Versus allergies, get on allergy med and nasal spray Versus from rash/macules increasing- he has a dermatologist, will follow up with them.

## 2019-08-11 ENCOUNTER — Other Ambulatory Visit: Payer: Self-pay | Admitting: Internal Medicine

## 2019-08-11 LAB — CBC WITH DIFFERENTIAL/PLATELET
Absolute Monocytes: 583 cells/uL (ref 200–950)
Basophils Absolute: 72 cells/uL (ref 0–200)
Basophils Relative: 1 %
Eosinophils Absolute: 353 cells/uL (ref 15–500)
Eosinophils Relative: 4.9 %
HCT: 45.2 % (ref 38.5–50.0)
Hemoglobin: 15.4 g/dL (ref 13.2–17.1)
Lymphs Abs: 2513 cells/uL (ref 850–3900)
MCH: 30.9 pg (ref 27.0–33.0)
MCHC: 34.1 g/dL (ref 32.0–36.0)
MCV: 90.8 fL (ref 80.0–100.0)
MPV: 9.3 fL (ref 7.5–12.5)
Monocytes Relative: 8.1 %
Neutro Abs: 3679 cells/uL (ref 1500–7800)
Neutrophils Relative %: 51.1 %
Platelets: 244 10*3/uL (ref 140–400)
RBC: 4.98 10*6/uL (ref 4.20–5.80)
RDW: 12.6 % (ref 11.0–15.0)
Total Lymphocyte: 34.9 %
WBC: 7.2 10*3/uL (ref 3.8–10.8)

## 2019-08-11 LAB — COMPLETE METABOLIC PANEL WITH GFR
AG Ratio: 1.8 (calc) (ref 1.0–2.5)
ALT: 30 U/L (ref 9–46)
AST: 27 U/L (ref 10–40)
Albumin: 4.4 g/dL (ref 3.6–5.1)
Alkaline phosphatase (APISO): 77 U/L (ref 36–130)
BUN: 13 mg/dL (ref 7–25)
CO2: 28 mmol/L (ref 20–32)
Calcium: 9.4 mg/dL (ref 8.6–10.3)
Chloride: 103 mmol/L (ref 98–110)
Creat: 1.29 mg/dL (ref 0.60–1.35)
GFR, Est African American: 80 mL/min/{1.73_m2} (ref >=60)
GFR, Est Non African American: 69 mL/min/{1.73_m2} (ref >=60)
Globulin: 2.4 g/dL (calc) (ref 1.9–3.7)
Glucose, Bld: 96 mg/dL (ref 65–99)
Potassium: 4.2 mmol/L (ref 3.5–5.3)
Sodium: 141 mmol/L (ref 135–146)
Total Bilirubin: 0.3 mg/dL (ref 0.2–1.2)
Total Protein: 6.8 g/dL (ref 6.1–8.1)

## 2019-08-11 LAB — HIV ANTIBODY (ROUTINE TESTING W REFLEX): HIV 1&2 Ab, 4th Generation: NONREACTIVE

## 2019-08-11 LAB — TSH: TSH: 3.72 mIU/L (ref 0.40–4.50)

## 2019-08-11 MED ORDER — TRETINOIN 0.05 % EX CREA: TOPICAL_CREAM | Freq: Every day | CUTANEOUS | 3 refills | Status: DC

## 2019-08-12 ENCOUNTER — Other Ambulatory Visit: Payer: Self-pay | Admitting: Internal Medicine

## 2019-08-12 MED ORDER — TRETINOIN 0.05 % EX CREA: TOPICAL_CREAM | Freq: Every day | CUTANEOUS | 3 refills | Status: DC

## 2019-08-15 NOTE — Progress Notes (Signed)
08/15/2019--PATIENT IS AWARE OF LAB RESULTS. Ricky Myers

## 2019-12-30 ENCOUNTER — Other Ambulatory Visit: Payer: Self-pay | Admitting: Internal Medicine

## 2019-12-30 MED ORDER — FINASTERIDE 5 MG PO TABS: ORAL_TABLET | ORAL | 1 refills | Status: DC

## 2019-12-30 MED ORDER — MONTELUKAST SODIUM 10 MG PO TABS: ORAL_TABLET | ORAL | 1 refills | Status: DC

## 2020-03-05 ENCOUNTER — Encounter: Payer: 59 | Admitting: Internal Medicine

## 2020-04-19 ENCOUNTER — Encounter: Payer: 59 | Admitting: Internal Medicine

## 2020-05-15 NOTE — Progress Notes (Signed)
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  Patient has been followed expectantly for labile elevated BP, lipids, abnormal glucose and Vitamin D Deficiency.     Patient's BP has been controlled at home.  Today's BP is at goal - 112/82. Patient denies any cardiac symptoms as chest pain, palpitations, shortness of breath, dizziness or ankle swelling.     Patient's hyperlipidemia is controlled with diet and medications. Patient denies myalgias or other medication SE's. Last lipids were at goal except slightly elevated:  Lab Results  Component Value Date   CHOL 178 04/18/2019   HDL 50 04/18/2019   LDLCALC 99 04/18/2019   TRIG 191 (H) 04/18/2019   CHOLHDL 3.6 04/18/2019       Patient has hx/o prediabetes since    and patient denies reactive hypoglycemic symptoms, visual blurring, diabetic polys or paresthesias. Last A1c was Normal & at goal:  Lab Results  Component Value Date   HGBA1C 5.6 04/18/2019        Finally, patient has history of Vitamin D Deficiency of    and last vitamin D was  Lab Results  Component Value Date   VD25OH 28 (L) 04/18/2019    Current Outpatient Medications on File Prior to Visit  Medication Sig  . Albuterol HFA) 108  inhaler Inhale 2 puffs every 4  hours as needed for wheezing   . aspirin EC 81 MG tablet Take  daily.  . finasteride (PROSCAR) 5 MG tablet Take 1 tablet Daily  . loratadine (CLARITIN) 10 MG tablet Take 10 mg daily.  . montelukast (SINGULAIR) 10 MG tablet Take 1 tablet Daily for Allergies  . tretinoin (RETIN-A) 0.05 % cream Apply topically at bedtime.  . valACYclovir (VALTREX) 1000 MG tablet daily.     No Known Allergies  Past Medical History:  Diagnosis Date  . Asthma    Health Maintenance  Topic Date Due  . INFLUENZA VACCINE   02/18/2020  . TETANUS/TDAP  05/02/2025  . Hepatitis C Screening  Completed  . HIV Screening  Completed   Immunization History  Administered Date(s) Administered  . Influenza Inj Mdck Quad Pf 05/30/2018  . PPD Test 04/02/2014, 05/03/2015, 11/10/2016, 04/18/2019  . Pneumococcal Polysaccharide-23 06/19/2012  . Tdap 05/03/2015    Past Surgical History:  Procedure Laterality Date  . no past surgery     Family History  Problem Relation Age of Onset  . Emphysema Maternal Grandmother   . Angioedema Neg Hx   . Asthma Neg Hx   . Immunodeficiency Neg Hx   . Eczema Neg Hx   . Urticaria Neg Hx    Social History   Socioeconomic History  . Marital status: Single    Spouse name: Not on file  . Number of children: Not on file  . Years of education: Not on file  . Highest education level: Not on file  Occupational History  . Not on file  Tobacco Use  . Smoking status: Never Smoker  . Smokeless tobacco: Never Used  Vaping Use  . Vaping Use: Never used  Substance and Sexual Activity  . Alcohol use: Yes    Alcohol/week: 0.0 standard drinks    Comment: 1 to 6 beers weekly  . Drug use: No  . Sexual activity: Not on file     ROS Constitutional: Denies fever, chills,  weight loss/gain, headaches, insomnia,  night sweats or change in appetite. Does c/o fatigue. Eyes: Denies redness, blurred vision, diplopia, discharge, itchy or watery eyes.  ENT: Denies discharge, congestion, post nasal drip, epistaxis, sore throat, earache, hearing loss, dental pain, Tinnitus, Vertigo, Sinus pain or snoring.  Cardio: Denies chest pain, palpitations, irregular heartbeat, syncope, dyspnea, diaphoresis, orthopnea, PND, claudication or edema Respiratory: denies cough, dyspnea, DOE, pleurisy, hoarseness, laryngitis or wheezing.  Gastrointestinal: Denies dysphagia, heartburn, reflux, water brash, pain, cramps, nausea, vomiting, bloating, diarrhea, constipation, hematemesis, melena, hematochezia, jaundice or  hemorrhoids Genitourinary: Denies dysuria, frequency, urgency, nocturia, hesitancy, discharge, hematuria or flank pain Musculoskeletal: Denies arthralgia, myalgia, stiffness, Jt. Swelling, pain, limp or strain/sprain. Denies Falls. Skin: Denies puritis, rash, hives, warts, acne, eczema or change in skin lesion Neuro: No weakness, tremor, incoordination, spasms, paresthesia or pain Psychiatric: Denies confusion, memory loss or sensory loss. Denies Depression. Endocrine: Denies change in weight, skin, hair change, nocturia, and paresthesia, diabetic polys, visual blurring or hyper / hypo glycemic episodes.  Heme/Lymph: No excessive bleeding, bruising or enlarged lymph nodes.  Physical Exam  BP 112/82   Pulse 83   Temp (!) 97 F (36.1 C)   Resp 16   Ht 5\' 5"  (1.651 m)   Wt 180 lb 3.2 oz (81.7 kg)   SpO2 97%   BMI 29.99 kg/m   General Appearance: Well nourished and well groomed and in no apparent distress.  Eyes: PERRLA, EOMs, conjunctiva no swelling or erythema, normal fundi and vessels. Sinuses: No frontal/maxillary tenderness ENT/Mouth: EACs patent / TMs  nl. Nares clear without erythema, swelling, mucoid exudates. Oral hygiene is good. No erythema, swelling, or exudate. Tongue normal, non-obstructing. Tonsils not swollen or erythematous. Hearing normal.  Neck: Supple, thyroid not palpable. No bruits, nodes or JVD. Respiratory: Respiratory effort normal.  BS equal and clear bilateral without rales, rhonci, wheezing or stridor. Cardio: Heart sounds are normal with regular rate and rhythm and no murmurs, rubs or gallops. Peripheral pulses are normal and equal bilaterally without edema. No aortic or femoral bruits. Chest: symmetric with normal excursions and percussion.  Abdomen: Soft, with Nl bowel sounds. Nontender, no guarding, rebound, hernias, masses, or organomegaly.  Lymphatics: Non tender without lymphadenopathy.  Musculoskeletal: Full ROM all peripheral extremities, joint  stability, 5/5 strength, and normal gait. Skin: Warm and dry without rashes, lesions, cyanosis, clubbing or  ecchymosis.  Neuro: Cranial nerves intact, reflexes equal bilaterally. Normal muscle tone, no cerebellar symptoms. Sensation intact.  Pysch: Alert and oriented X 3 with normal affect, insight and judgment appropriate.   Assessment and Plan  1. Annual Preventative/Screening Exam    2. Elevated BP without diagnosis of hypertension   - EKG 12-Lead - Urinalysis, Routine w reflex microscopic - Microalbumin / creatinine urine ratio - CBC with Differential/Platelet - COMPLETE METABOLIC PANEL WITH GFR - Magnesium  3. Hyperlipidemia, mixed   - EKG 12-Lead - Lipid panel  4. Abnormal glucose   - EKG 12-Lead - Hemoglobin A1c - Insulin, random  5. Vitamin D deficiency   - VITAMIN D 25 Hydroxy (Vit-D Deficiency, Fractures)  6. Screening for colorectal cancer   - POC Hemoccult Bld/Stl (3-Cd Home Screen); Future  7. Screening examination for pulmonary tuberculosis    - TB Skin Test  8. Screening for ischemic heart disease  - EKG 12-Lead  9. Fatigue, unspecified type  - Iron,Total/Total Iron Binding Cap - Vitamin B12 - Testosterone - CBC with Differential/Platelet - TSH  10. Medication management  - Urinalysis, Routine w reflex  microscopic - Microalbumin / creatinine urine ratio - CBC with Differential/Platelet - COMPLETE METABOLIC PANEL WITH GFR - Magnesium - Lipid panel - TSH - Hemoglobin A1c - Insulin, random - VITAMIN D 25 Hydroxy  . Need for immunization against influenza  - FLU VACCINE MDCK QUAD W/Preservative            Patient was counseled in prudent diet, weight control to achieve/maintain BMI less than 25, BP monitoring, regular exercise and medications as discussed.  Discussed med effects and SE's. Routine screening labs and tests as requested with regular follow-up as recommended. Over 40 minutes of exam, counseling, chart review and  high complex critical decision making was performed   Marinus Maw, MD

## 2020-05-16 ENCOUNTER — Ambulatory Visit: Payer: 59 | Admitting: Internal Medicine

## 2020-05-16 ENCOUNTER — Encounter: Payer: Self-pay | Admitting: Internal Medicine

## 2020-05-16 ENCOUNTER — Other Ambulatory Visit: Payer: Self-pay

## 2020-05-16 VITALS — BP 112/82 | HR 83 | Temp 97.0°F | Resp 16 | Ht 65.0 in | Wt 180.2 lb

## 2020-05-16 DIAGNOSIS — Z0000 Encounter for general adult medical examination without abnormal findings: Secondary | ICD-10-CM

## 2020-05-16 DIAGNOSIS — R030 Elevated blood-pressure reading, without diagnosis of hypertension: Secondary | ICD-10-CM | POA: Diagnosis not present

## 2020-05-16 DIAGNOSIS — Z0001 Encounter for general adult medical examination with abnormal findings: Secondary | ICD-10-CM

## 2020-05-16 DIAGNOSIS — Z1211 Encounter for screening for malignant neoplasm of colon: Secondary | ICD-10-CM

## 2020-05-16 DIAGNOSIS — Z136 Encounter for screening for cardiovascular disorders: Secondary | ICD-10-CM | POA: Diagnosis not present

## 2020-05-16 DIAGNOSIS — R5383 Other fatigue: Secondary | ICD-10-CM

## 2020-05-16 DIAGNOSIS — Z125 Encounter for screening for malignant neoplasm of prostate: Secondary | ICD-10-CM

## 2020-05-16 DIAGNOSIS — E559 Vitamin D deficiency, unspecified: Secondary | ICD-10-CM

## 2020-05-16 DIAGNOSIS — Z23 Encounter for immunization: Secondary | ICD-10-CM

## 2020-05-16 DIAGNOSIS — Z111 Encounter for screening for respiratory tuberculosis: Secondary | ICD-10-CM | POA: Diagnosis not present

## 2020-05-16 DIAGNOSIS — Z79899 Other long term (current) drug therapy: Secondary | ICD-10-CM

## 2020-05-16 DIAGNOSIS — R7309 Other abnormal glucose: Secondary | ICD-10-CM

## 2020-05-16 DIAGNOSIS — E782 Mixed hyperlipidemia: Secondary | ICD-10-CM

## 2020-05-16 NOTE — Patient Instructions (Signed)
Due to recent changes in healthcare laws, you may see the results of your imaging and laboratory studies on MyChart before your provider has had a chance to review them.  We understand that in some cases there may be results that are confusing or concerning to you. Not all laboratory results come back in the same time frame and the provider may be waiting for multiple results in order to interpret others.  Please give us 48 hours in order for your provider to thoroughly review all the results before contacting the office for clarification of your results.   ++++++++++++++++++++++++++++++++++++++  Vit D  & Vit C 1,000 mg   are recommended to help protect  against the Covid-19 and other Corona viruses.    Also it's recommended  to take  Zinc 50 mg  to help  protect against the Covid-19   and best place to get  is also on [employer]  and don't pay more than 6-8 cents /pill !  =============================== Coronavirus (COVID-19) Are you at risk?  Are you at risk for the Coronavirus (COVID-19)?  To be considered HIGH RISK for Coronavirus (COVID-19), you have to meet the following criteria:  . Traveled to [place], [place], [place], [place] or [place]; or in the [place] to [place], [place], [place]  . or [place]; and have fever, cough, and shortness of breath within the last 2 weeks of travel OR . Been in close contact with a person diagnosed with COVID-19 within the last 2 weeks and have  . fever, cough,and shortness of breath .  . IF YOU DO NOT MEET THESE CRITERIA, YOU ARE CONSIDERED LOW RISK FOR COVID-19.  What to do if you are HIGH RISK for COVID-19?  . If you are having a medical emergency, call 911. . Seek medical care right away. Before you go to a doctor's office, urgent care or emergency department, .  call ahead and tell them about your recent travel, contact with someone diagnosed with COVID-19  .  and your symptoms.  . You should receive instructions from your  physician's office regarding next steps of care.  . When you arrive at healthcare provider, tell the healthcare staff immediately you have returned from  . visiting [place], [place], [place], [place] or [place]; or traveled in the [place] to [place], [place],  . [place] or [place] in the last two weeks or you have been in close contact with a person diagnosed with  . COVID-19 in the last 2 weeks.   . Tell the health care staff about your symptoms: fever, cough and shortness of breath. . After you have been seen by a medical provider, you will be either: o Tested for (COVID-19) and discharged home on quarantine except to seek medical care if  o symptoms worsen, and asked to  - Stay home and avoid contact with others until you get your results (4-5 days)  - Avoid travel on public transportation if possible (such as bus, train, or airplane) or o Sent to the Emergency Department by EMS for evaluation, COVID-19 testing  and  o possible admission depending on your condition and test results.  What to do if you are LOW RISK for COVID-19?  Reduce your risk of any infection by using the same precautions used for avoiding the common cold or flu:  . Wash your hands often with soap and warm water for at least 20 seconds.  If soap and water are not readily   available,  . use an alcohol-based hand sanitizer with at least 60% alcohol.  . If coughing or sneezing, cover your mouth and nose by coughing or sneezing into the elbow areas of your shirt or coat, .  into a tissue or into your sleeve (not your hands). . Avoid shaking hands with others and consider head nods or verbal greetings only. . Avoid touching your eyes, nose, or mouth with unwashed hands.  . Avoid close contact with people who are sick. . Avoid places or events with large numbers of people in one location, like concerts or sporting events. . Carefully consider travel plans you have or are making. . If you are planning any travel  outside or inside the [place], visit the CDC's Travelers' Health webpage for the latest health notices. . If you have some symptoms but not all symptoms, continue to monitor at home and seek medical attention  . if your symptoms worsen. . If you are having a medical emergency, call 911. >>>>>>>>>>>>>>>>>>>>>>>>>>>> Preventive Care for Adults  A healthy lifestyle and preventive care can promote health and wellness. Preventive health guidelines for men include the following key practices:  A routine yearly physical is a good way to check with your health care provider about your health and preventative screening. It is a chance to share any concerns and updates on your health and to receive a thorough exam.  Visit your dentist for a routine exam and preventative care every 6 months. Brush your teeth twice a day and floss once a day. Good oral hygiene prevents tooth decay and gum disease.  The frequency of eye exams is based on your age, health, family medical history, use of contact lenses, and other factors. Follow your health care provider's recommendations for frequency of eye exams.  Eat a healthy diet. Foods such as vegetables, fruits, whole grains, low-fat dairy products, and lean protein foods contain the nutrients you need without too many calories. Decrease your intake of foods high in solid fats, added sugars, and salt. Eat the right amount of calories for you. Get information about a proper diet from your health care provider, if necessary.  Regular physical exercise is one of the most important things you can do for your health. Most adults should get at least 150 minutes of moderate-intensity exercise (any activity that increases your heart rate and causes you to sweat) each week. In addition, most adults need muscle-strengthening exercises on 2 or more days a week.  Maintain a healthy weight. The body mass index (BMI) is a screening tool to identify possible weight problems. It provides an  estimate of body fat based on height and weight. Your health care provider can find your BMI and can help you achieve or maintain a healthy weight. For adults 20 years and older:  A BMI below 18.5 is considered underweight.  A BMI of 18.5 to 24.9 is normal.  A BMI of 25 to 29.9 is considered overweight.  A BMI of 30 and above is considered obese.  Maintain normal blood lipids and cholesterol levels by exercising and minimizing your intake of saturated fat. Eat a balanced diet with plenty of fruit and vegetables. Blood tests for lipids and cholesterol should begin at age 20 and be repeated every 5 years. If your lipid or cholesterol levels are high, you are over 50, or you are at high risk for heart disease, you may need your cholesterol levels checked more frequently. Ongoing high lipid and cholesterol levels should be treated   with medicines if diet and exercise are not working.  If you smoke, find out from your health care provider how to quit. If you do not use tobacco, do not start.  Lung cancer screening is recommended for adults aged 55-80 years who are at high risk for developing lung cancer because of a history of smoking. A yearly low-dose CT scan of the lungs is recommended for people who have at least a 30-pack-year history of smoking and are a current smoker or have quit within the past 15 years. A pack year of smoking is smoking an average of 1 pack of cigarettes a day for 1 year (for example: 1 pack a day for 30 years or 2 packs a day for 15 years). Yearly screening should continue until the smoker has stopped smoking for at least 15 years. Yearly screening should be stopped for people who develop a health problem that would prevent them from having lung cancer treatment.  If you choose to drink alcohol, do not have more than 2 drinks per day. One drink is considered to be 12 ounces (355 mL) of beer, 5 ounces (148 mL) of wine, or 1.5 ounces (44 mL) of liquor.  Avoid use of street  drugs. Do not share needles with anyone. Ask for help if you need support or instructions about stopping the use of drugs.  High blood pressure causes heart disease and increases the risk of stroke. Your blood pressure should be checked at least every 1-2 years. Ongoing high blood pressure should be treated with medicines, if weight loss and exercise are not effective.  If you are 45-79 years old, ask your health care provider if you should take aspirin to prevent heart disease.  Diabetes screening involves taking a blood sample to check your fasting blood sugar level. This should be done once every 3 years, after age 45, if you are within normal weight and without risk factors for diabetes. Testing should be considered at a younger age or be carried out more frequently if you are overweight and have at least 1 risk factor for diabetes.  Colorectal cancer can be detected and often prevented. Most routine colorectal cancer screening begins at the age of 50 and continues through age 75. However, your health care provider may recommend screening at an earlier age if you have risk factors for colon cancer. On a yearly basis, your health care provider may provide home test kits to check for hidden blood in the stool. Use of a small camera at the end of a tube to directly examine the colon (sigmoidoscopy or colonoscopy) can detect the earliest forms of colorectal cancer. Talk to your health care provider about this at age 50, when routine screening begins. Direct exam of the colon should be repeated every 5-10 years through age 75, unless early forms of precancerous polyps or small growths are found.   Talk with your health care provider about prostate cancer screening.  Testicular cancer screening isrecommended for adult males. Screening includes self-exam, a health care provider exam, and other screening tests. Consult with your health care provider about any symptoms you have or any concerns you have about  testicular cancer.  Use sunscreen. Apply sunscreen liberally and repeatedly throughout the day. You should seek shade when your shadow is shorter than you. Protect yourself by wearing long sleeves, pants, a wide-brimmed hat, and sunglasses year round, whenever you are outdoors.  Once a month, do a whole-body skin exam, using a mirror to look at   the skin on your back. Tell your health care provider about new moles, moles that have irregular borders, moles that are larger than a pencil eraser, or moles that have changed in shape or color.  Stay current with required vaccines (immunizations).  Influenza vaccine. All adults should be immunized every year.  Tetanus, diphtheria, and acellular pertussis (Td, Tdap) vaccine. An adult who has not previously received Tdap or who does not know his vaccine status should receive 1 dose of Tdap. This initial dose should be followed by tetanus and diphtheria toxoids (Td) booster doses every 10 years. Adults with an unknown or incomplete history of completing a 3-dose immunization series with Td-containing vaccines should begin or complete a primary immunization series including a Tdap dose. Adults should receive a Td booster every 10 years.  Varicella vaccine. An adult without evidence of immunity to varicella should receive 2 doses or a second dose if he has previously received 1 dose.  Human papillomavirus (HPV) vaccine. Males aged 13-21 years who have not received the vaccine previously should receive the 3-dose series. Males aged 22-26 years may be immunized. Immunization is recommended through the age of 26 years for any male who has sex with males and did not get any or all doses earlier. Immunization is recommended for any person with an immunocompromised condition through the age of 26 years if he did not get any or all doses earlier. During the 3-dose series, the second dose should be obtained 4-8 weeks after the first dose. The third dose should be obtained  24 weeks after the first dose and 16 weeks after the second dose.  Zoster vaccine. One dose is recommended for adults aged 60 years or older unless certain conditions are present.    PREVNAR  - Pneumococcal 13-valent conjugate (PCV13) vaccine. When indicated, a person who is uncertain of his immunization history and has no record of immunization should receive the PCV13 vaccine. An adult aged 19 years or older who has certain medical conditions and has not been previously immunized should receive 1 dose of PCV13 vaccine. This PCV13 should be followed with a dose of pneumococcal polysaccharide (PPSV23) vaccine. The PPSV23 vaccine dose should be obtained at least 1 r more year(s) after the dose of PCV13 vaccine. An adult aged 19 years or older who has certain medical conditions and previously received 1 or more doses of PPSV23 vaccine should receive 1 dose of PCV13. The PCV13 vaccine dose should be obtained 1 or more years after the last PPSV23 vaccine dose.    PNEUMOVAX - Pneumococcal polysaccharide (PPSV23) vaccine. When PCV13 is also indicated, PCV13 should be obtained first. All adults aged 65 years and older should be immunized. An adult younger than age 65 years who has certain medical conditions should be immunized. Any person who resides in a nursing home or long-term care facility should be immunized. An adult smoker should be immunized. People with an immunocompromised condition and certain other conditions should receive both PCV13 and PPSV23 vaccines. People with human immunodeficiency virus (HIV) infection should be immunized as soon as possible after diagnosis. Immunization during chemotherapy or radiation therapy should be avoided. Routine use of PPSV23 vaccine is not recommended for American Indians, [place] Natives, or people younger than 65 years unless there are medical conditions that require PPSV23 vaccine. When indicated, people who have unknown immunization and have no record of  immunization should receive PPSV23 vaccine. One-time revaccination 5 years after the first dose of PPSV23 is recommended for people aged   19-64 years who have chronic kidney failure, nephrotic syndrome, asplenia, or immunocompromised conditions. People who received 1-2 doses of PPSV23 before age 65 years should receive another dose of PPSV23 vaccine at age 65 years or later if at least 5 years have passed since the previous dose. Doses of PPSV23 are not needed for people immunized with PPSV23 at or after age 65 years.    Hepatitis A vaccine. Adults who wish to be protected from this disease, have certain high-risk conditions, work with hepatitis A-infected animals, work in hepatitis A research labs, or travel to or work in countries with a high rate of hepatitis A should be immunized. Adults who were previously unvaccinated and who anticipate close contact with an international adoptee during the first 60 days after arrival in the [place] from a country with a high rate of hepatitis A should be immunized.    Hepatitis B vaccine. Adults should be immunized if they wish to be protected from this disease, have certain high-risk conditions, may be exposed to blood or other infectious body fluids, are household contacts or sex partners of hepatitis B positive people, are clients or workers in certain care facilities, or travel to or work in countries with a high rate of hepatitis B.   Preventive Service / Frequency   Ages 40 to 64  Blood pressure check.  Lipid and cholesterol check  Lung cancer screening. / Every year if you are aged 55-80 years and have a 30-pack-year history of smoking and currently smoke or have quit within the past 15 years. Yearly screening is stopped once you have quit smoking for at least 15 years or develop a health problem that would prevent you from having lung cancer treatment.  Fecal occult blood test (FOBT) of stool. / Every year beginning at age 50 and continuing  until age 75. You may not have to do this test if you get a colonoscopy every 10 years.  Flexible sigmoidoscopy** or colonoscopy.** / Every 5 years for a flexible sigmoidoscopy or every 10 years for a colonoscopy beginning at age 50 and continuing until age 75. Screening for abdominal aortic aneurysm (AAA)  by ultrasound is recommended for people who have history of high blood pressure or who are current or former smokers. +++++++++++ Recommend Adult Low Dose Aspirin or  coated  Aspirin 81 mg daily  To reduce risk of Colon Cancer 40 %,  Skin Cancer 26 % ,  Malignant Melanoma 46%  and  Pancreatic cancer 60% ++++++++++++++++++++ Vitamin D goal  is between 70-100.  Please make sure that you are taking your Vitamin D as directed.  It is very important as a natural anti-inflammatory  helping hair, skin, and nails, as well as reducing stroke and heart attack risk.  It helps your bones and helps with mood. It also decreases numerous cancer risks so please take it as directed.  Low Vit D is associated with a 200-300% higher risk for CANCER  and 200-300% higher risk for HEART   ATTACK  &  STROKE.   ...................................... It is also associated with higher death rate at younger ages,  autoimmune diseases like Rheumatoid arthritis, Lupus, Multiple Sclerosis.    Also many other serious conditions, like depression, Alzheimer's Dementia, infertility, muscle aches, fatigue, fibromyalgia - just to name a few. +++++++++++++++++++++ Recommend the book "The END of DIETING" by Dr Joel Fuhrman  & the book "The END of DIABETES " by Dr Joel Fuhrman At [employer] - get book & Audio CD's      Being diabetic has a  300% increased risk for heart attack, stroke, cancer, and alzheimer- type vascular dementia. It is very important that you work harder with diet by avoiding all foods that are white. Avoid white rice (brown & wild rice is OK), white potatoes (sweetpotatoes in moderation is OK), White  bread or wheat bread or anything made out of white flour like bagels, donuts, rolls, buns, biscuits, cakes, pastries, cookies, pizza crust, and pasta (made from white flour & egg whites) - vegetarian pasta or spinach or wheat pasta is OK. Multigrain breads like Arnold's or Pepper[employer], or multigrain sandwich thins or flatbreads.  Diet, exercise and weight loss can reverse and cure diabetes in the early stages.  Diet, exercise and weight loss is very important in the control and prevention of complications of diabetes which affects every system in your body, ie. Brain - dementia/stroke, eyes - glaucoma/blindness, heart - heart attack/heart failure, kidneys - dialysis, stomach - gastric paralysis, intestines - malabsorption, nerves - severe painful neuritis, circulation - gangrene & loss of a leg(s), and finally cancer and Alzheimers.    I recommend avoid fried & greasy foods,  sweets/candy, white rice (brown or wild rice or Quinoa is OK), white potatoes (sweet potatoes are OK) - anything made from white flour - bagels, doughnuts, rolls, buns, biscuits,white and wheat breads, pizza crust and traditional pasta made of white flour & egg white(vegetarian pasta or spinach or wheat pasta is OK).  Multi-grain bread is OK - like multi-grain flat bread or sandwich thins. Avoid alcohol in excess. Exercise is also important.    Eat all the vegetables you want - avoid meat, especially red meat and dairy - especially cheese.  Cheese is the most concentrated form of trans-fats which is the worst thing to clog up our arteries. Veggie cheese is OK which can be found in the fresh produce section at [employer] or [employer] or [employer]  ++++++++++++++++++++++ DASH Eating Plan  DASH stands for "Dietary Approaches to Stop Hypertension."   The DASH eating plan is a healthy eating plan that has been shown to reduce high blood pressure (hypertension). Additional health benefits may include reducing the risk of type 2  diabetes mellitus, heart disease, and stroke. The DASH eating plan may also help with weight loss. WHAT DO I NEED TO KNOW ABOUT THE DASH EATING PLAN? For the DASH eating plan, you will follow these general guidelines:  Choose foods with a percent daily value for sodium of less than 5% (as listed on the food label).  Use salt-free seasonings or herbs instead of table salt or sea salt.  Check with your health care provider or pharmacist before using salt substitutes.  Eat lower-sodium products, often labeled as "lower sodium" or "no salt added."  Eat fresh foods.  Eat more vegetables, fruits, and low-fat dairy products.  Choose whole grains. Look for the word "whole" as the first word in the ingredient list.  Choose fish   Limit sweets, desserts, sugars, and sugary drinks.  Choose heart-healthy fats.  Eat veggie cheese   Eat more home-cooked food and less restaurant, buffet, and fast food.  Limit fried foods.  Scripter foods using methods other than frying.  Limit canned vegetables. If you do use them, rinse them well to decrease the sodium.  When eating at a restaurant, ask that your food be prepared with less salt, or no salt if possible.                        WHAT FOODS CAN I EAT? Read Dr Joel Fuhrman's books on The End of Dieting & The End of Diabetes  Grains Whole grain or whole wheat bread. Brown rice. Whole grain or whole wheat pasta. Quinoa, bulgur, and whole grain cereals. Low-sodium cereals. Corn or whole wheat flour tortillas. Whole grain cornbread. Whole grain crackers. Low-sodium crackers.  Vegetables Fresh or frozen vegetables (raw, steamed, roasted, or grilled). Low-sodium or reduced-sodium tomato and vegetable juices. Low-sodium or reduced-sodium tomato sauce and paste. Low-sodium or reduced-sodium canned vegetables.   Fruits All fresh, canned (in natural juice), or frozen fruits.  Protein Products  All fish and seafood.  Dried beans, peas, or lentils.  Unsalted nuts and seeds. Unsalted canned beans.  Dairy Low-fat dairy products, such as skim or 1% milk, 2% or reduced-fat cheeses, low-fat ricotta or cottage cheese, or plain low-fat yogurt. Low-sodium or reduced-sodium cheeses.  Fats and Oils Tub margarines without trans fats. Light or reduced-fat mayonnaise and salad dressings (reduced sodium). Avocado. Safflower, olive, or canola oils. Natural peanut or almond butter.  Other Unsalted popcorn and pretzels. The items listed above may not be a complete list of recommended foods or beverages. Contact your dietitian for more options.  +++++++++++++++++++  WHAT FOODS ARE NOT RECOMMENDED? Grains/ White flour or wheat flour White bread. White pasta. White rice. Refined cornbread. Bagels and croissants. Crackers that contain trans fat.  Vegetables  Creamed or fried vegetables. Vegetables in a . Regular canned vegetables. Regular canned tomato sauce and paste. Regular tomato and vegetable juices.  Fruits Dried fruits. Canned fruit in light or heavy syrup. Fruit juice.  Meat and Other Protein Products Meat in general - RED meat & White meat.  Fatty cuts of meat. Ribs, chicken wings, all processed meats as bacon, sausage, bologna, salami, fatback, hot dogs, bratwurst and packaged luncheon meats.  Dairy Whole or 2% milk, cream, half-and-half, and cream cheese. Whole-fat or sweetened yogurt. Full-fat cheeses or blue cheese. Non-dairy creamers and whipped toppings. Processed cheese, cheese spreads, or cheese curds.  Condiments Onion and garlic salt, seasoned salt, table salt, and sea salt. Canned and packaged gravies. Worcestershire sauce. Tartar sauce. Barbecue sauce. Teriyaki sauce. Soy sauce, including reduced sodium. Steak sauce. Fish sauce. Oyster sauce. Cocktail sauce. Horseradish. Ketchup and mustard. Meat flavorings and tenderizers. Bouillon cubes. Hot sauce. Tabasco sauce. Marinades. Taco seasonings. Relishes.  Fats and Oils Butter,  stick margarine, lard, shortening and bacon fat. Coconut, palm kernel, or palm oils. Regular salad dressings.  Pickles and olives. Salted popcorn and pretzels.  The items listed above may not be a complete list of foods and beverages to avoid.    

## 2020-05-17 LAB — MICROALBUMIN / CREATININE URINE RATIO
Creatinine, Urine: 56 mg/dL (ref 20–320)
Microalb, Ur: <0.2 mg/dL

## 2020-05-17 LAB — URINALYSIS, ROUTINE W REFLEX MICROSCOPIC
Bacteria, UA: NONE SEEN /HPF
Bilirubin Urine: NEGATIVE
Glucose, UA: NEGATIVE
Hgb urine dipstick: NEGATIVE
Hyaline Cast: NONE SEEN /LPF
Ketones, ur: NEGATIVE
Nitrite: NEGATIVE
Protein, ur: NEGATIVE
RBC / HPF: NONE SEEN /HPF (ref 0–2)
Specific Gravity, Urine: 1.007 (ref 1.001–1.03)
Squamous Epithelial / HPF: NONE SEEN /HPF (ref <=5)
WBC, UA: NONE SEEN /HPF (ref 0–5)
pH: 7 (ref 5.0–8.0)

## 2020-05-17 LAB — COMPLETE METABOLIC PANEL WITH GFR
AG Ratio: 2 (calc) (ref 1.0–2.5)
ALT: 36 U/L (ref 9–46)
AST: 30 U/L (ref 10–40)
Albumin: 4.4 g/dL (ref 3.6–5.1)
Alkaline phosphatase (APISO): 81 U/L (ref 36–130)
BUN/Creatinine Ratio: 10 (calc) (ref 6–22)
BUN: 16 mg/dL (ref 7–25)
CO2: 33 mmol/L — ABNORMAL HIGH (ref 20–32)
Calcium: 9.4 mg/dL (ref 8.6–10.3)
Chloride: 105 mmol/L (ref 98–110)
Creat: 1.61 mg/dL — ABNORMAL HIGH (ref 0.60–1.35)
GFR, Est African American: 61 mL/min/{1.73_m2} (ref >=60)
GFR, Est Non African American: 53 mL/min/{1.73_m2} — ABNORMAL LOW (ref >=60)
Globulin: 2.2 g/dL (calc) (ref 1.9–3.7)
Glucose, Bld: 74 mg/dL (ref 65–99)
Potassium: 4.1 mmol/L (ref 3.5–5.3)
Sodium: 140 mmol/L (ref 135–146)
Total Bilirubin: 0.5 mg/dL (ref 0.2–1.2)
Total Protein: 6.6 g/dL (ref 6.1–8.1)

## 2020-05-17 LAB — LIPID PANEL
Cholesterol: 179 mg/dL (ref <200)
HDL: 42 mg/dL (ref >=40)
Non-HDL Cholesterol (Calc): 137 mg/dL (calc) — ABNORMAL HIGH (ref <130)
Total CHOL/HDL Ratio: 4.3 (calc) (ref <5.0)
Triglycerides: 474 mg/dL — ABNORMAL HIGH (ref <150)

## 2020-05-17 LAB — HEMOGLOBIN A1C
Hgb A1c MFr Bld: 5.5 % of total Hgb (ref <5.7)
Mean Plasma Glucose: 111 (calc)
eAG (mmol/L): 6.2 (calc)

## 2020-05-17 LAB — IRON, TOTAL/TOTAL IRON BINDING CAP
%SAT: 44 % (calc) (ref 20–48)
Iron: 129 ug/dL (ref 50–180)
TIBC: 292 mcg/dL (calc) (ref 250–425)

## 2020-05-17 LAB — CBC WITH DIFFERENTIAL/PLATELET
Absolute Monocytes: 531 cells/uL (ref 200–950)
Basophils Absolute: 62 cells/uL (ref 0–200)
Basophils Relative: 0.9 %
Eosinophils Absolute: 359 cells/uL (ref 15–500)
Eosinophils Relative: 5.2 %
HCT: 44.8 % (ref 38.5–50.0)
Hemoglobin: 15.4 g/dL (ref 13.2–17.1)
Lymphs Abs: 2180 cells/uL (ref 850–3900)
MCH: 32.2 pg (ref 27.0–33.0)
MCHC: 34.4 g/dL (ref 32.0–36.0)
MCV: 93.7 fL (ref 80.0–100.0)
MPV: 9.3 fL (ref 7.5–12.5)
Monocytes Relative: 7.7 %
Neutro Abs: 3767 cells/uL (ref 1500–7800)
Neutrophils Relative %: 54.6 %
Platelets: 247 10*3/uL (ref 140–400)
RBC: 4.78 10*6/uL (ref 4.20–5.80)
RDW: 12.7 % (ref 11.0–15.0)
Total Lymphocyte: 31.6 %
WBC: 6.9 10*3/uL (ref 3.8–10.8)

## 2020-05-17 LAB — INSULIN, RANDOM: Insulin: 28.4 u[IU]/mL — ABNORMAL HIGH

## 2020-05-17 LAB — PSA: PSA: 0.17 ng/mL (ref <=4.0)

## 2020-05-17 LAB — VITAMIN D 25 HYDROXY (VIT D DEFICIENCY, FRACTURES): Vit D, 25-Hydroxy: 35 ng/mL (ref 30–100)

## 2020-05-17 LAB — TSH: TSH: 3.14 mIU/L (ref 0.40–4.50)

## 2020-05-17 LAB — TESTOSTERONE: Testosterone: 307 ng/dL (ref 250–827)

## 2020-05-17 LAB — VITAMIN B12: Vitamin B-12: 752 pg/mL (ref 200–1100)

## 2020-05-17 LAB — MAGNESIUM: Magnesium: 2.4 mg/dL (ref 1.5–2.5)

## 2020-05-19 NOTE — Progress Notes (Signed)
========================================================== ==========================================================  -   Iron & Vitamin B12 levels - Both Normal & OK  ==========================================================  - PSA - very Low - Great  ==========================================================  - Testosterone Level is Normal  ==========================================================  - Creatinine test is a little elevated & suggests not drinking enough liquids  - Recc drink at east 4 bottles (16 oz) of liquids /day  ==========================================================  - Total Chol =  Excellent   - Very low risk for Heart Attack  / Stroke =============================================================  - But Triglycerides (   474   ) or fats in blood are too high  (goal is less than 150)    - Recommend avoid fried & greasy foods,  sweets / candy,   - Avoid white rice  (brown or wild rice or Quinoa is OK),   - Avoid white potatoes  (sweet potatoes are OK)   - Avoid anything made from white flour  - bagels, doughnuts, rolls, buns, biscuits, white and   wheat breads, pizza crust and traditional  pasta made of white flour & egg white  - (vegetarian pasta or spinach or wheat pasta is OK).    - Multi-grain bread is OK - like multi-grain flat bread or  sandwich thins.   - Avoid alcohol in excess.   - Exercise is also important.  ==========================================================  - A1c - Normal -Great - No Diabetes ==========================================================  - Vitamin D = 35 - extremely low   - Vitamin D goal is between 70-100.   $$$$$$$$$$$$$$$$$$$$$$$$$$$$$$$$$$$$$$$$$$$$$$$$$$$$$$$$$$$$$  - Please  take Vitamin D 5,000 units  /Day   $$$$$$$$$$$$$$$$$$$$$$$$$$$$$$$$$$$$$$$$$$$$$$$$$$$$$$$$$$$$$  - It is very important as a natural anti-inflammatory and helping the  immune system protect against viral infections,  like the Covid-19    helping hair, skin, and nails, as well as reducing stroke and  heart attack risk.   - It helps your bones and helps with mood.  - It also decreases numerous cancer risks so please  take it as directed.   - Low Vit D is associated with a 200-300% higher risk for  CANCER   and 200-300% higher risk for HEART   ATTACK  &  STROKE.    - It is also associated with higher death rate at younger ages,   autoimmune diseases like Rheumatoid arthritis, Lupus,  Multiple Sclerosis.     - Also many other serious conditions, like depression, Alzheimer's  Dementia, infertility, impotency, muscle aches, fatigue, fibromyalgia   - just to name a few. ==========================================================  - All Else - CBC - Kidneys - Electrolytes - Liver - Magnesium & Thyroid    - all  Normal / OK ==========================================================

## 2020-05-20 ENCOUNTER — Other Ambulatory Visit: Payer: Self-pay | Admitting: Internal Medicine

## 2020-05-20 DIAGNOSIS — R059 Cough, unspecified: Secondary | ICD-10-CM

## 2020-05-20 MED ORDER — BENZONATATE 200 MG PO CAPS
ORAL_CAPSULE | ORAL | 1 refills | Status: DC
Start: 2020-05-20 — End: 2020-07-01

## 2020-05-20 MED ORDER — DEXAMETHASONE 4 MG PO TABS: ORAL_TABLET | ORAL | 0 refills | Status: DC

## 2020-05-29 ENCOUNTER — Encounter: Payer: Self-pay | Admitting: Family Medicine

## 2020-05-29 ENCOUNTER — Other Ambulatory Visit: Payer: Self-pay

## 2020-05-29 ENCOUNTER — Ambulatory Visit: Payer: 59 | Admitting: Family Medicine

## 2020-05-29 VITALS — BP 104/64 | HR 78 | Temp 97.7°F | Resp 18 | Ht 64.0 in | Wt 175.0 lb

## 2020-05-29 DIAGNOSIS — R059 Cough, unspecified: Secondary | ICD-10-CM | POA: Diagnosis not present

## 2020-05-29 DIAGNOSIS — K219 Gastro-esophageal reflux disease without esophagitis: Secondary | ICD-10-CM

## 2020-05-29 DIAGNOSIS — J4540 Moderate persistent asthma, uncomplicated: Secondary | ICD-10-CM | POA: Insufficient documentation

## 2020-05-29 DIAGNOSIS — J3089 Other allergic rhinitis: Secondary | ICD-10-CM

## 2020-05-29 DIAGNOSIS — J4541 Moderate persistent asthma with (acute) exacerbation: Secondary | ICD-10-CM | POA: Diagnosis not present

## 2020-05-29 MED ORDER — BREZTRI AEROSPHERE 160-9-4.8 MCG/ACT IN AERO: 2.0000 | INHALATION_SPRAY | Freq: Two times a day (BID) | RESPIRATORY_TRACT | 5 refills | Status: DC

## 2020-05-29 MED ORDER — FLUTICASONE PROPIONATE 50 MCG/ACT NA SUSP: 2.0000 | Freq: Every day | NASAL | 5 refills | Status: DC | PRN

## 2020-05-29 NOTE — Progress Notes (Addendum)
[address] [place] [address] Dept: [phone]  FOLLOW UP NOTE  Patient ID: Ricky Myers, male    DOB: August 23, 1980  Age: 39 y.o. MRN: [id number] Date of Office Visit: 05/29/2020  Assessment  Chief Complaint: Cough  HPI Ricky Myers is a 39 year old male who presents to the clinic for evaluation of cough. He was last seen in this clinic on 02-01-2018 by Dr. Beaulah Dinning for evaluation of asthma and allergic rhinitis. At today's visit, he reports that he began to experience a dry cough that began on October 25.  He notes that he called his primary care provider on October 28 and received Decadron taper and benzonatate.  He reports these medications did not improve his cough.  About 3 days ago he called his doctor on demand and received his Advair Diskus 250 and is currently using 1 puff twice a day.  He reports continuous shortness of breath that is worse with activity, continuous wheezing, and dry cough with occasional clear mucus production that began on October 25 and has not improved.  He continues Advair discus 250-1 puff twice a day that he began 3 days ago, montelukast 10 mg once a day, and has been using albuterol 1-2 times a day for the last week and a half with no improvement in his cough.  Allergic rhinitis is reported as moderately well controlled with mild nasal congestion.  He denies rhinorrhea, sneeze, and postnasal drainage.  He is currently taking cetirizine 10 mg once a day and is not using any nasal steroid spray or nasal saline spray.  He reports reflux occurs occasionally when eating spicy food or pizza too close to bedtime.  He is not taking a medication to control reflux at this time.  He denies fever and sick contacts and reports that he has taken a home COVID test on Monday that was negative. He has had 2 doses of Pfizer COVID vaccine. His current medications are listed in the chart.    Drug Allergies:  No Known Allergies  Physical Exam: BP 104/64 (BP Location:  Right Arm, Patient Position: Sitting, Cuff Size: Normal)   Pulse 78   Temp 97.7 F (36.5 C) (Oral)   Resp 18   Ht 5\' 4"  (1.626 m)   Wt 175 lb (79.4 kg)   SpO2 96%   BMI 30.04 kg/m    Physical Exam Vitals reviewed.  Constitutional:      Appearance: Normal appearance.  HENT:     Head: Normocephalic and atraumatic.     Right Ear: Tympanic membrane normal.     Left Ear: Tympanic membrane normal.     Nose:     Comments: Bilateral nares slightly erythematous with clear nasal drainage noted.  Pharynx slightly erythematous with no exudate.  Ears normal.  Eyes normal. Eyes:     Conjunctiva/sclera: Conjunctivae normal.  Cardiovascular:     Rate and Rhythm: Normal rate and regular rhythm.     Heart sounds: Normal heart sounds. No murmur heard.   Pulmonary:     Effort: Pulmonary effort is normal.     Breath sounds: Normal breath sounds.     Comments: Lungs clear to auscultation Musculoskeletal:        General: Normal range of motion.     Cervical back: Normal range of motion and neck supple.  Skin:    General: Skin is warm and dry.  Neurological:     Mental Status: He is alert and oriented to person, place,  and time.  Psychiatric:        Mood and Affect: Mood normal.        Behavior: Behavior normal.        Thought Content: Thought content normal.        Judgment: Judgment normal.    Diagnostics: FVC 2.66, FEV1 2.34.  Predicted FVC 4.48, predicted FEV1 3.60.  Spirometry indicates moderate restriction. Post bronchodilator therapy FVC 2.78, FEV1 2.40. Postbronchodilator spirometry indicates moderate restriction with no significant bronchodilator response. This is consistent with previous spirometry readings.  Assessment and Plan: 1. Moderate persistent asthma, unspecified whether complicated   2. Non-seasonal allergic rhinitis due to fungal spores   3. Gastroesophageal reflux disease, unspecified whether esophagitis present   4. Cough     Meds ordered this encounter    Medications  . Budeson-Glycopyrrol-Formoterol (BREZTRI AEROSPHERE) 160-9-4.8 MCG/ACT AERO    Sig: Inhale 2 puffs into the lungs 2 (two) times daily.    Dispense:  10.7 g    Refill:  5  . fluticasone (FLONASE) 50 MCG/ACT nasal spray    Sig: Place 2 sprays into both nostrils daily as needed for allergies or rhinitis (for stuffy nose).    Dispense:  16 g    Refill:  5    Patient Instructions  Asthma Stop Advair Diskus Begin Breztri 2 puffs twice a day with a spacer to prevent cough or wheeze Continue montelukast 10 mg once a day to prevent cough or wheeze Continue albuterol 2 puffs every 4 hours as needed for cough or wheeze  Allergic rhinitis Continue allergen avoidance measures directed toward mold as listed below Stop cetirizine for 1 week. Then you may restart an antihistamine once a day as needed for a runny nose or itch. Remember to rotate to a different antihistamine about every 3 months. Some examples of over the counter antihistamines include Zyrtec (cetirizine), Xyzal (levocetirizine), Allegra (fexofenadine), and Claritin (loratidine).  Begin Flonase 2 sprays in each nostril once a day as needed for a stuffy nose.  In the right nostril, point the applicator out toward the right ear. In the left nostril, point the applicator out toward the left ear Consider saline nasal rinses as needed for nasal symptoms. Use this before any medicated nasal sprays for best result For thick post nasal drainage, begin Mucinex [phone] mg twice a day and increase fluid intake to thin mucus  Reflux Begin dietary and lifestyle modifications as listed below Begin omeprazole 20 mg once a day for the next 30 days.   Cough Continue the treatment plans listed above Continue Delsym cough syrup as needed.   Call the clinic if this treatment plan is not working well for you  Follow up in 1 month or sooner if needed.   Return in about 4 weeks (around 06/26/2020), or if symptoms worsen or fail to  improve.    Thank you for the opportunity to care for this patient.  Please do not hesitate to contact me with questions.  Thermon Leyland, FNP [facility]  ________________________________________________  I have provided oversight concerning [facility]'s evaluation and treatment of this patient's health issues addressed during today's encounter.  I agree with the assessment and therapeutic plan as outlined in the note.   Signed,   R Jorene Guest, MD

## 2020-05-29 NOTE — Patient Instructions (Signed)
Asthma Stop Advair Diskus Begin Breztri 2 puffs twice a day with a spacer to prevent cough or wheeze Continue montelukast 10 mg once a day to prevent cough or wheeze Continue albuterol 2 puffs every 4 hours as needed for cough or wheeze  Allergic rhinitis Continue allergen avoidance measures directed toward mold as listed below Stop cetirizine for 1 week. Then you may restart an antihistamine once a day as needed for a runny nose or itch. Remember to rotate to a different antihistamine about every 3 months. Some examples of over the counter antihistamines include Zyrtec (cetirizine), Xyzal (levocetirizine), Allegra (fexofenadine), and Claritin (loratidine).  Begin Flonase 2 sprays in each nostril once a day as needed for a stuffy nose.  In the right nostril, point the applicator out toward the right ear. In the left nostril, point the applicator out toward the left ear Consider saline nasal rinses as needed for nasal symptoms. Use this before any medicated nasal sprays for best result For thick post nasal drainage, begin Mucinex [phone] mg twice a day and increase fluid intake to thin mucus  Reflux Begin dietary and lifestyle modifications as listed below Begin omeprazole 20 mg once a day for the next 30 days.   Cough Continue the treatment plans listed above Continue Delsym cough syrup as needed.   Call the clinic if this treatment plan is not working well for you  Follow up in 1 month or sooner if needed.  Control of Mold Allergen Mold and fungi can grow on a variety of surfaces provided certain temperature and moisture conditions exist.  Outdoor molds grow on plants, decaying vegetation and soil.  The major outdoor mold, Alternaria and Cladosporium, are found in very high numbers during hot and dry conditions.  Generally, a late Summer - Fall peak is seen for common outdoor fungal spores.  Rain will temporarily lower outdoor mold spore count, but counts rise rapidly when the rainy  period ends.  The most important indoor molds are Aspergillus and Penicillium.  Dark, humid and poorly ventilated basements are ideal sites for mold growth.  The next most common sites of mold growth are the bathroom and the kitchen.  Outdoor [employer] 1. Use air conditioning and keep windows closed 2. Avoid exposure to decaying vegetation. 3. Avoid leaf raking. 4. Avoid grain handling. 5. Consider wearing a face mask if working in moldy areas.  Indoor Mold Control 1. Maintain humidity below 50%. 2. Clean washable surfaces with 5% bleach solution. 3. Remove sources e.g. Contaminated carpets.   Lifestyle Changes for Controlling GERD When you have GERD, stomach acid feels as if it's backing up toward your mouth. Whether or not you take medication to control your GERD, your symptoms can often be improved with lifestyle changes.   Raise Your Head  Reflux is more likely to strike when you're lying down flat, because stomach fluid can  flow backward more easily. Raising the head of your bed 4-6 inches can help. To do this:  Slide blocks or books under the legs at the head of your bed. Or, place a wedge under  the mattress. Many foam stores can make a suitable wedge for you. The wedge  should run from your waist to the top of your head.  Don't just prop your head on several pillows. This increases pressure on your  stomach. It can make GERD worse.  Watch Your Eating Habits Certain foods may increase the acid in your stomach or relax the lower esophageal sphincter, making  GERD more likely. It's best to avoid the following:  Coffee, tea, and carbonated drinks (with and without caffeine)  Fatty, fried, or spicy food  Mint, chocolate, onions, and tomatoes  Any other foods that seem to irritate your stomach or cause you pain  Relieve the Pressure  Eat smaller meals, even if you have to eat more often.  Don't lie down right after you eat. Wait a few hours for your stomach to  empty.  Avoid tight belts and tight-fitting clothes.  Lose excess weight.  Tobacco and Alcohol  Avoid smoking tobacco and drinking alcohol. They can make GERD symptoms worse.

## 2020-06-03 ENCOUNTER — Telehealth: Payer: Self-pay

## 2020-06-03 ENCOUNTER — Other Ambulatory Visit: Payer: Self-pay

## 2020-06-03 ENCOUNTER — Telehealth: Payer: Self-pay | Admitting: Family Medicine

## 2020-06-03 ENCOUNTER — Ambulatory Visit (HOSPITAL_BASED_OUTPATIENT_CLINIC_OR_DEPARTMENT_OTHER)
Admission: RE | Admit: 2020-06-03 | Discharge: 2020-06-03 | Disposition: A | Discharge status: Home / Self Care | Payer: 59 | Source: Ambulatory Visit | Attending: Family Medicine | Admitting: Family Medicine

## 2020-06-03 DIAGNOSIS — R059 Cough, unspecified: Secondary | ICD-10-CM | POA: Diagnosis present

## 2020-06-03 MED ORDER — PREDNISONE 10 MG PO TABS: ORAL_TABLET | ORAL | 0 refills | Status: DC

## 2020-06-03 MED ORDER — OMEPRAZOLE 20 MG PO CPDR: 20.0000 mg | DELAYED_RELEASE_CAPSULE | Freq: Every day | ORAL | 0 refills | Status: DC

## 2020-06-03 MED ORDER — AZITHROMYCIN 250 MG PO TABS: ORAL_TABLET | ORAL | 0 refills | Status: DC

## 2020-06-03 NOTE — Telephone Encounter (Signed)
Can you please make sure he is continuing to use Breztri-2 puffs twice a day with a spacer, flonase 2 sprays in each nostril daily, saline nasal rinses daily, Mucinex 1200 mg twice a day and omeprazole daily. Please order a 2 view cxr for him and we will call results as soon as available. Thank you

## 2020-06-03 NOTE — Telephone Encounter (Signed)
Spoke with patient and verbally gave instructions from [facility]. He didn't have a rx for omeprazole 20 mg so it was sent to pharmacy. Instructed patient to get CXR at [facility]. Patient verbally agreed and all questions answered.

## 2020-06-03 NOTE — Telephone Encounter (Signed)
Patient called stating he is still coughing, wheezing & some shortness of breath. Patient is having drainage on the right side and down his throat.   Please Advise.  [facility] [address]

## 2020-06-03 NOTE — Telephone Encounter (Signed)
CXR negative for cardiopulmonary disease. Results reported to patient.

## 2020-06-03 NOTE — Telephone Encounter (Signed)
CXR results reported to patient. Patient reports that he continues to experience cough which is now occasionally productive. He continues to experience occasional wheeze. He continues Brezrti, montelukast, and albuterol. He reports that he just began using his nasal sprays today. Will call in azithromycin and a prednisone taper. He will call the clinic with any fever or worsening symptoms.

## 2020-06-07 ENCOUNTER — Telehealth: Payer: Self-pay | Admitting: Family Medicine

## 2020-06-07 NOTE — Telephone Encounter (Signed)
Please advise to ENT referral thank you

## 2020-06-07 NOTE — Telephone Encounter (Signed)
Can you please refer this patient to ENT with earliest availability for cough despite aggressive treatment? Thank you  Patient denies fever or pain in his throat. He agrees to continue current therapy and have a follow up appointment with ENT for evaluation and treatment.

## 2020-06-07 NOTE — Telephone Encounter (Signed)
PT called to report he is still coughing and doesn't feel good. Finished z pak today and taking other meds as prescribed.

## 2020-06-07 NOTE — Telephone Encounter (Signed)
Pt called, he remembered the ENT he saw before- Dr Flo Shanks and wanted to know if it okay to see her again.

## 2020-06-07 NOTE — Telephone Encounter (Signed)
Pt is still coughing since oct 25th. It comes and goes sporadically. Pt is done with z pak, still on prednisone, delsym cough syrup (did not help), tried zyrtec, mucinex, advail sinus pressure, flonase nasal spray, breztri inhaler, pt feels fine just the cough is annoying

## 2020-06-11 NOTE — Telephone Encounter (Signed)
Patient was seen at [facility] today. Please see note in care everywhere.  Thanks

## 2020-06-11 NOTE — Telephone Encounter (Signed)
Excellent.  Thank you 

## 2020-07-01 ENCOUNTER — Encounter: Payer: Self-pay | Admitting: Family Medicine

## 2020-07-01 ENCOUNTER — Other Ambulatory Visit: Payer: Self-pay

## 2020-07-01 ENCOUNTER — Ambulatory Visit: Payer: 59 | Admitting: Family Medicine

## 2020-07-01 VITALS — BP 110/62 | HR 94 | Resp 18

## 2020-07-01 DIAGNOSIS — J3089 Other allergic rhinitis: Secondary | ICD-10-CM

## 2020-07-01 DIAGNOSIS — R059 Cough, unspecified: Secondary | ICD-10-CM | POA: Diagnosis not present

## 2020-07-01 DIAGNOSIS — J4540 Moderate persistent asthma, uncomplicated: Secondary | ICD-10-CM | POA: Diagnosis not present

## 2020-07-01 DIAGNOSIS — K219 Gastro-esophageal reflux disease without esophagitis: Secondary | ICD-10-CM | POA: Diagnosis not present

## 2020-07-01 NOTE — Patient Instructions (Addendum)
Asthma Continue Breztri 2 puffs twice a day with a spacer to prevent cough or wheeze Continue montelukast 10 mg once a day to prevent cough or wheeze Continue albuterol 2 puffs every 4 hours as needed for cough or wheeze  Allergic rhinitis Continue allergen avoidance measures directed toward mold as listed below Continue cetirizine 10 mg once a day as needed for a runny nose or itch. Remember to rotate to a different antihistamine about every 3 months. Some examples of over the counter antihistamines include Zyrtec (cetirizine), Xyzal (levocetirizine), Allegra (fexofenadine), and Claritin (loratidine).  Continue Flonase 2 sprays in each nostril once a day as needed for a stuffy nose.  In the right nostril, point the applicator out toward the right ear. In the left nostril, point the applicator out toward the left ear Consider saline nasal rinses as needed for nasal symptoms. Use this before any medicated nasal sprays for best result For thick post nasal drainage, begin Mucinex [phone] mg twice a day and increase fluid intake to thin mucus  Reflux Continue dietary and lifestyle modifications as listed below Continue omeprazole 20 mg once a day for the next 30 days.   Cough Continue the treatment plans listed above  Call the clinic if this treatment plan is not working well for you  Follow up in 4 months or sooner if needed.  Control of Mold Allergen Mold and fungi can grow on a variety of surfaces provided certain temperature and moisture conditions exist.  Outdoor molds grow on plants, decaying vegetation and soil.  The major outdoor mold, Alternaria and Cladosporium, are found in very high numbers during hot and dry conditions.  Generally, a late Summer - Fall peak is seen for common outdoor fungal spores.  Rain will temporarily lower outdoor mold spore count, but counts rise rapidly when the rainy period ends.  The most important indoor molds are Aspergillus and Penicillium.  Dark, humid  and poorly ventilated basements are ideal sites for mold growth.  The next most common sites of mold growth are the bathroom and the kitchen.  Outdoor [employer] 1. Use air conditioning and keep windows closed 2. Avoid exposure to decaying vegetation. 3. Avoid leaf raking. 4. Avoid grain handling. 5. Consider wearing a face mask if working in moldy areas.  Indoor Mold Control 1. Maintain humidity below 50%. 2. Clean washable surfaces with 5% bleach solution. 3. Remove sources e.g. Contaminated carpets.   Lifestyle Changes for Controlling GERD When you have GERD, stomach acid feels as if it's backing up toward your mouth. Whether or not you take medication to control your GERD, your symptoms can often be improved with lifestyle changes.   Raise Your Head  Reflux is more likely to strike when you're lying down flat, because stomach fluid can  flow backward more easily. Raising the head of your bed 4-6 inches can help. To do this:  Slide blocks or books under the legs at the head of your bed. Or, place a wedge under  the mattress. Many foam stores can make a suitable wedge for you. The wedge  should run from your waist to the top of your head.  Don't just prop your head on several pillows. This increases pressure on your  stomach. It can make GERD worse.  Watch Your Eating Habits Certain foods may increase the acid in your stomach or relax the lower esophageal sphincter, making GERD more likely. It's best to avoid the following:  Coffee, tea, and carbonated drinks (with and  without caffeine)  Fatty, fried, or spicy food  Mint, chocolate, onions, and tomatoes  Any other foods that seem to irritate your stomach or cause you pain  Relieve the Pressure  Eat smaller meals, even if you have to eat more often.  Don't lie down right after you eat. Wait a few hours for your stomach to empty.  Avoid tight belts and tight-fitting clothes.  Lose excess weight.  Tobacco  and Alcohol  Avoid smoking tobacco and drinking alcohol. They can make GERD symptoms worse.

## 2020-07-01 NOTE — Progress Notes (Addendum)
[address] [place] [address] Dept: [phone]  FOLLOW UP NOTE  Patient ID: Ricky Myers, male    DOB: [birth year]  Age: 39 y.o. MRN: [id number] Date of Office Visit: 07/01/2020  Assessment  Chief Complaint: Asthma (Doing much better. Says he almost cancelled the appointment. No more issues with cough. Symptoms resolved about 1-2 weeks ago. )  HPI Ricky Myers is a 39 year old male who presents to the clinic for follow-up visit.  He was last seen in this clinic on 05/29/2020 for evaluation of asthma, allergic rhinitis, reflux, and cough.  In the interim, on 06/11/2020 he was evaluated at [facility]. At today's visit, he reports his cough has resolved about 1-1/2 weeks ago and he continues omeprazole 20 mg about 5 to 6 days a week.  Asthma is reported as moderately well controlled with occasional shortness of breath and wheeze that occurs mostly during rest.  He continues Breztri 2 puffs twice a day on about 5 or 6 days a week, if he remembers.  Allergic rhinitis is reported as well controlled with cetirizine, Flonase, and saline rinses as needed.  His current medications are listed in the chart.   Drug Allergies:  No Known Allergies  Physical Exam: BP 110/62 (BP Location: Right Arm, Patient Position: Sitting, Cuff Size: Normal)   Pulse 94   Resp 18   SpO2 97%    Physical Exam Vitals reviewed.  Constitutional:      Appearance: Normal appearance.  HENT:     Head: Normocephalic and atraumatic.     Right Ear: Tympanic membrane normal.     Left Ear: Tympanic membrane normal.     Nose:     Comments: Bilateral nares slightly erythematous with clear nasal drainage noted.  Pharynx normal.  Ears normal.  Eyes normal.    Mouth/Throat:     Pharynx: Oropharynx is clear.  Eyes:     Conjunctiva/sclera: Conjunctivae normal.  Cardiovascular:     Rate and Rhythm: Normal rate and regular rhythm.     Heart sounds: Normal heart sounds.  No murmur heard.   Pulmonary:     Effort: Pulmonary effort is normal.     Breath sounds: Normal breath sounds.     Comments: Lungs clear to auscultation Musculoskeletal:        General: Normal range of motion.     Cervical back: Normal range of motion and neck supple.  Skin:    General: Skin is warm and dry.  Neurological:     Mental Status: He is alert and oriented to person, place, and time.  Psychiatric:        Mood and Affect: Mood normal.        Behavior: Behavior normal.        Thought Content: Thought content normal.        Judgment: Judgment normal.     Diagnostics: FVC 2.71, FEV1 2.35.  Predicted FVC 4.48, predicted FEV1 3.60.  Spirometry indicates moderate restriction.  This is consistent with previous spirometry readings.  Assessment and Plan: 1. Moderate persistent asthma, unspecified whether complicated   2. Cough   3. Non-seasonal allergic rhinitis due to fungal spores   4. Gastroesophageal reflux disease, unspecified whether esophagitis present     Patient Instructions  Asthma Continue Breztri 2 puffs twice a day with a spacer to prevent cough or wheeze Continue montelukast 10 mg once a day to prevent cough or wheeze Continue albuterol 2 puffs  every 4 hours as needed for cough or wheeze  Allergic rhinitis Continue allergen avoidance measures directed toward mold as listed below Continue cetirizine 10 mg once a day as needed for a runny nose or itch. Remember to rotate to a different antihistamine about every 3 months. Some examples of over the counter antihistamines include Zyrtec (cetirizine), Xyzal (levocetirizine), Allegra (fexofenadine), and Claritin (loratidine).  Continue Flonase 2 sprays in each nostril once a day as needed for a stuffy nose.  In the right nostril, point the applicator out toward the right ear. In the left nostril, point the applicator out toward the left ear Consider saline nasal rinses as needed for nasal symptoms. Use this before any  medicated nasal sprays for best result For thick post nasal drainage, begin Mucinex [phone] mg twice a day and increase fluid intake to thin mucus  Reflux Continue dietary and lifestyle modifications as listed below Continue omeprazole 20 mg once a day for the next 30 days.   Cough Continue the treatment plans listed above  Call the clinic if this treatment plan is not working well for you  Follow up in 4 months or sooner if needed.   Return in about 4 months (around 10/30/2020), or if symptoms worsen or fail to improve.    Thank you for the opportunity to care for this patient.  Please do not hesitate to contact me with questions.  Thermon Leyland, FNP [facility]  ________________________________________________  I have provided oversight concerning [facility]'s evaluation and treatment of this patient's health issues addressed during today's encounter.  I agree with the assessment and therapeutic plan as outlined in the note.   Signed,   R Jorene Guest, MD

## 2020-08-26 ENCOUNTER — Other Ambulatory Visit: Payer: Self-pay | Admitting: Internal Medicine

## 2020-11-05 ENCOUNTER — Ambulatory Visit: Payer: 59 | Admitting: Family Medicine

## 2020-11-05 DIAGNOSIS — J309 Allergic rhinitis, unspecified: Secondary | ICD-10-CM

## 2020-11-05 NOTE — Patient Instructions (Incomplete)
Asthma Continue Breztri 2 puffs twice a day with a spacer to prevent cough or wheeze Continue montelukast 10 mg once a day to prevent cough or wheeze Continue albuterol 2 puffs every 4 hours as needed for cough or wheeze  Allergic rhinitis Continue allergen avoidance measures directed toward mold as listed below Continue cetirizine 10 mg once a day as needed for a runny nose or itch. Remember to rotate to a different antihistamine about every 3 months. Some examples of over the counter antihistamines include Zyrtec (cetirizine), Xyzal (levocetirizine), Allegra (fexofenadine), and Claritin (loratidine).  Continue Flonase 2 sprays in each nostril once a day as needed for a stuffy nose.  In the right nostril, point the applicator out toward the right ear. In the left nostril, point the applicator out toward the left ear Consider saline nasal rinses as needed for nasal symptoms. Use this before any medicated nasal sprays for best result For thick post nasal drainage, begin Mucinex 600-1200 mg twice a day and increase fluid intake to thin mucus  Reflux Continue dietary and lifestyle modifications as listed below Continue omeprazole 20 mg once a day for the next 30 days.   Cough Continue the treatment plans listed above  Call the clinic if this treatment plan is not working well for you  Follow up in 4 months or sooner if needed.  Control of Mold Allergen Mold and fungi can grow on a variety of surfaces provided certain temperature and moisture conditions exist.  Outdoor molds grow on plants, decaying vegetation and soil.  The major outdoor mold, Alternaria and Cladosporium, are found in very high numbers during hot and dry conditions.  Generally, a late Summer - Fall peak is seen for common outdoor fungal spores.  Rain will temporarily lower outdoor mold spore count, but counts rise rapidly when the rainy period ends.  The most important indoor molds are Aspergillus and Penicillium.  Dark, humid  and poorly ventilated basements are ideal sites for mold growth.  The next most common sites of mold growth are the bathroom and the kitchen.  Outdoor Mold Control 1. Use air conditioning and keep windows closed 2. Avoid exposure to decaying vegetation. 3. Avoid leaf raking. 4. Avoid grain handling. 5. Consider wearing a face mask if working in moldy areas.  Indoor Mold Control 1. Maintain humidity below 50%. 2. Clean washable surfaces with 5% bleach solution. 3. Remove sources e.g. Contaminated carpets.   Lifestyle Changes for Controlling GERD When you have GERD, stomach acid feels as if it's backing up toward your mouth. Whether or not you take medication to control your GERD, your symptoms can often be improved with lifestyle changes.   Raise Your Head  Reflux is more likely to strike when you're lying down flat, because stomach fluid can  flow backward more easily. Raising the head of your bed 4-6 inches can help. To do this:  Slide blocks or books under the legs at the head of your bed. Or, place a wedge under  the mattress. Many foam stores can make a suitable wedge for you. The wedge  should run from your waist to the top of your head.  Don't just prop your head on several pillows. This increases pressure on your  stomach. It can make GERD worse.  Watch Your Eating Habits Certain foods may increase the acid in your stomach or relax the lower esophageal sphincter, making GERD more likely. It's best to avoid the following:  Coffee, tea, and carbonated drinks (with and   without caffeine)  Fatty, fried, or spicy food  Mint, chocolate, onions, and tomatoes  Any other foods that seem to irritate your stomach or cause you pain  Relieve the Pressure  Eat smaller meals, even if you have to eat more often.  Don't lie down right after you eat. Wait a few hours for your stomach to empty.  Avoid tight belts and tight-fitting clothes.  Lose excess weight.  Tobacco  and Alcohol  Avoid smoking tobacco and drinking alcohol. They can make GERD symptoms worse.

## 2020-11-05 NOTE — Progress Notes (Deleted)
   [address] [place] [address] Dept: [phone]  FOLLOW UP NOTE  Patient ID: Ricky Myers, male    DOB: 22-May-1981  Age: 40 y.o. MRN: [id number] Date of Office Visit: 11/05/2020  Assessment  Chief Complaint: No chief complaint on file.  HPI Ricky Myers    Drug Allergies:  No Known Allergies  Physical Exam: There were no vitals taken for this visit.   Physical Exam  Diagnostics:    Assessment and Plan: No diagnosis found.  No orders of the defined types were placed in this encounter.   There are no Patient Instructions on file for this visit.  No follow-ups on file.    Thank you for the opportunity to care for this patient.  Please do not hesitate to contact me with questions.  Thermon Leyland, FNP [facility]

## 2020-11-07 ENCOUNTER — Other Ambulatory Visit: Payer: Self-pay | Admitting: *Deleted

## 2020-11-07 MED ORDER — TRETINOIN 0.05 % EX CREA
TOPICAL_CREAM | Freq: Every day | CUTANEOUS | 3 refills | Status: DC
Start: 2020-11-07 — End: 2021-05-22

## 2021-01-13 ENCOUNTER — Other Ambulatory Visit: Payer: Self-pay

## 2021-01-13 ENCOUNTER — Encounter: Payer: Self-pay | Admitting: Family Medicine

## 2021-01-13 ENCOUNTER — Ambulatory Visit: Payer: 59 | Admitting: Family Medicine

## 2021-01-13 VITALS — BP 90/64 | HR 84 | Temp 97.7°F | Resp 20 | Ht 63.0 in | Wt 174.4 lb

## 2021-01-13 DIAGNOSIS — K219 Gastro-esophageal reflux disease without esophagitis: Secondary | ICD-10-CM | POA: Diagnosis not present

## 2021-01-13 DIAGNOSIS — J3089 Other allergic rhinitis: Secondary | ICD-10-CM | POA: Diagnosis not present

## 2021-01-13 DIAGNOSIS — J4540 Moderate persistent asthma, uncomplicated: Secondary | ICD-10-CM

## 2021-01-13 NOTE — Progress Notes (Signed)
[address] [place] [address] Dept: [phone]  FOLLOW UP NOTE  Patient ID: Ricky Myers, male    DOB: 05-Dec-1980  Age: 40 y.o. MRN: [id number] Date of Office Visit: 01/13/2021  Assessment  Chief Complaint: Asthma  HPI Ricky Myers is a 40 year old male who presents to the clinic for follow-up visit.  Was last seen in this clinic on 07/01/2020 for evaluation of asthma, cough, allergic rhinitis, and reflux.  At today's visit, he reports his asthma has been moderately well controlled with symptoms including intermittent occasional shortness of breath and wheeze he denies cough with activity or rest.  He has recently started swimming several days of the week into better physical shape.  He continues montelukast 10 mg about 5 days a week and has not used Breztri or albuterol since February.  Was not interested in restarting breast tree in conjunction with physical activity to try to improve his symptoms of asthma.  Allergic rhinitis is reported as well controlled with cetirizine, Flonase, and saline rinses as needed.  He reports his cough has completely cleared at this time.  Reflux is reported as well controlled with no medical intervention.  His current medications are listed in the chart.   Drug Allergies:  No Known Allergies  Physical Exam: BP 90/64 (BP Location: Left Arm, Patient Position: Sitting, Cuff Size: Normal)   Pulse 84   Temp 97.7 F (36.5 C) (Temporal)   Resp 20   Ht 5\' 3"  (1.6 m)   Wt 174 lb 6.4 oz (79.1 kg)   SpO2 99%   BMI 30.89 kg/m    Physical Exam Vitals reviewed.  Constitutional:      Appearance: Normal appearance.  HENT:     Head: Normocephalic and atraumatic.     Right Ear: Tympanic membrane normal.     Left Ear: Tympanic membrane normal.     Nose:     Comments: Bilateral nares slightly erythematous with clear nasal drainage noted.  Pharynx normal.  Ears normal.  Eyes normal.    Mouth/Throat:     Pharynx: Oropharynx is clear.  Eyes:      Conjunctiva/sclera: Conjunctivae normal.  Cardiovascular:     Rate and Rhythm: Normal rate and regular rhythm.     Heart sounds: Normal heart sounds. No murmur heard. Pulmonary:     Effort: Pulmonary effort is normal.     Breath sounds: Normal breath sounds.     Comments: Lungs clear to auscultation Musculoskeletal:        General: Normal range of motion.     Cervical back: Normal range of motion and neck supple.  Skin:    General: Skin is warm and dry.  Neurological:     Mental Status: He is alert and oriented to person, place, and time.  Psychiatric:        Mood and Affect: Mood normal.        Behavior: Behavior normal.        Thought Content: Thought content normal.        Judgment: Judgment normal.    Diagnostics: FVC 2.58, FEV1 2.17.  Predicted FVC 4.15, predicted FEV1 3.34.  Spirometry indicates moderate restriction.  This is consistent with previous spirometry readings.  Assessment and Plan: 1. Moderate persistent asthma without complication   2. Non-seasonal allergic rhinitis due to fungal spores   3. Gastroesophageal reflux disease, unspecified whether esophagitis present     Meds ordered this encounter  Medications   albuterol (VENTOLIN HFA) 108 (90  Base) MCG/ACT inhaler    Sig: Inhale 2 puffs into the lungs every 4 (four) hours as needed for wheezing or shortness of breath.    Dispense:  1 each    Refill:  1     Patient Instructions  Asthma Continue Breztri 2 puffs twice a day with a spacer to prevent cough or wheeze. Call in 30 days to give [place] an update with your breathing Continue montelukast 10 mg once a day to prevent cough or wheeze Continue albuterol 2 puffs every 4 hours as needed for cough or wheeze You may use albuterol 2 puffs 5-15 minutes before activity to reduce cough or wheeze  Allergic rhinitis Continue allergen avoidance measures directed toward mold as listed below Continue cetirizine 10 mg once a day as needed for a runny nose or itch.  Remember to rotate to a different antihistamine about every 3 months. Some examples of over the counter antihistamines include Zyrtec (cetirizine), Xyzal (levocetirizine), Allegra (fexofenadine), and Claritin (loratidine).  Continue Flonase 2 sprays in each nostril once a day as needed for a stuffy nose.  In the right nostril, point the applicator out toward the right ear. In the left nostril, point the applicator out toward the left ear Consider saline nasal rinses as needed for nasal symptoms. Use this before any medicated nasal sprays for best result For thick post nasal drainage, begin Mucinex [phone] mg twice a day and increase fluid intake to thin mucus  Reflux Continue dietary and lifestyle modifications as listed below  Call the clinic if this treatment plan is not working well for you  Follow up in 6 months or sooner if needed.   Return in about 6 months (around 07/15/2021), or if symptoms worsen or fail to improve.    Thank you for the opportunity to care for this patient.  Please do not hesitate to contact me with questions.  Thermon Leyland, FNP [facility]

## 2021-01-13 NOTE — Patient Instructions (Addendum)
Asthma Continue Breztri 2 puffs twice a day with a spacer to prevent cough or wheeze. Call in 30 days to give [place] an update with your breathing Continue montelukast 10 mg once a day to prevent cough or wheeze Continue albuterol 2 puffs every 4 hours as needed for cough or wheeze You may use albuterol 2 puffs 5-15 minutes before activity to reduce cough or wheeze  Allergic rhinitis Continue allergen avoidance measures directed toward mold as listed below Continue cetirizine 10 mg once a day as needed for a runny nose or itch. Remember to rotate to a different antihistamine about every 3 months. Some examples of over the counter antihistamines include Zyrtec (cetirizine), Xyzal (levocetirizine), Allegra (fexofenadine), and Claritin (loratidine).  Continue Flonase 2 sprays in each nostril once a day as needed for a stuffy nose.  In the right nostril, point the applicator out toward the right ear. In the left nostril, point the applicator out toward the left ear Consider saline nasal rinses as needed for nasal symptoms. Use this before any medicated nasal sprays for best result For thick post nasal drainage, begin Mucinex [phone] mg twice a day and increase fluid intake to thin mucus  Reflux Continue dietary and lifestyle modifications as listed below  Call the clinic if this treatment plan is not working well for you  Follow up in 6 months or sooner if needed.  Control of Mold Allergen Mold and fungi can grow on a variety of surfaces provided certain temperature and moisture conditions exist.  Outdoor molds grow on plants, decaying vegetation and soil.  The major outdoor mold, Alternaria and Cladosporium, are found in very high numbers during hot and dry conditions.  Generally, a late Summer - Fall peak is seen for common outdoor fungal spores.  Rain will temporarily lower outdoor mold spore count, but counts rise rapidly when the rainy period ends.  The most important indoor molds are Aspergillus  and Penicillium.  Dark, humid and poorly ventilated basements are ideal sites for mold growth.  The next most common sites of mold growth are the bathroom and the kitchen.  Outdoor [employer] Use air conditioning and keep windows closed Avoid exposure to decaying vegetation. Avoid leaf raking. Avoid grain handling. Consider wearing a face mask if working in moldy areas.  Indoor Mold Control Maintain humidity below 50%. Clean washable surfaces with 5% bleach solution. Remove sources e.g. Contaminated carpets.   Lifestyle Changes for Controlling GERD When you have GERD, stomach acid feels as if it's backing up toward your mouth. Whether or not you take medication to control your GERD, your symptoms can often be improved with lifestyle changes.   Raise Your Head Reflux is more likely to strike when you're lying down flat, because stomach fluid can flow backward more easily. Raising the head of your bed 4-6 inches can help. To do this: Slide blocks or books under the legs at the head of your bed. Or, place a wedge under the mattress. Many foam stores can make a suitable wedge for you. The wedge should run from your waist to the top of your head. Don't just prop your head on several pillows. This increases pressure on your stomach. It can make GERD worse.  Watch Your Eating Habits Certain foods may increase the acid in your stomach or relax the lower esophageal sphincter, making GERD more likely. It's best to avoid the following: Coffee, tea, and carbonated drinks (with and without caffeine) Fatty, fried, or spicy food Mint, chocolate, onions,  and tomatoes Any other foods that seem to irritate your stomach or cause you pain  Relieve the Pressure Eat smaller meals, even if you have to eat more often. Don't lie down right after you eat. Wait a few hours for your stomach to empty. Avoid tight belts and tight-fitting clothes. Lose excess weight.  Tobacco and Alcohol Avoid smoking  tobacco and drinking alcohol. They can make GERD symptoms worse.

## 2021-01-14 ENCOUNTER — Encounter: Payer: Self-pay | Admitting: Family Medicine

## 2021-01-14 MED ORDER — ALBUTEROL SULFATE HFA 108 (90 BASE) MCG/ACT IN AERS: 2.0000 | INHALATION_SPRAY | RESPIRATORY_TRACT | 1 refills | Status: DC | PRN

## 2021-05-22 ENCOUNTER — Ambulatory Visit (INDEPENDENT_AMBULATORY_CARE_PROVIDER_SITE_OTHER): Payer: 59 | Admitting: Internal Medicine

## 2021-05-22 DIAGNOSIS — R5383 Other fatigue: Secondary | ICD-10-CM

## 2021-05-22 DIAGNOSIS — E559 Vitamin D deficiency, unspecified: Secondary | ICD-10-CM

## 2021-05-22 DIAGNOSIS — R030 Elevated blood-pressure reading, without diagnosis of hypertension: Secondary | ICD-10-CM

## 2021-05-22 DIAGNOSIS — R7309 Other abnormal glucose: Secondary | ICD-10-CM

## 2021-05-22 DIAGNOSIS — E782 Mixed hyperlipidemia: Secondary | ICD-10-CM

## 2021-05-22 DIAGNOSIS — Z91199 Patient's noncompliance with other medical treatment and regimen due to unspecified reason: Secondary | ICD-10-CM

## 2021-05-22 NOTE — Progress Notes (Signed)
Ricky Myers                                                                                                                                                                                                      Future Appointments  Date Time Provider Department Center  05/22/2021  3:00 PM Lucky Cowboy, MD [facility] None  05/25/2022  3:00 PM Lucky Cowboy, MD [facility] None            This very nice 40 y.o. SWM presents for a Screening /Preventative Visit & comprehensive evaluation and management of multiple medical co-morbidities.  Patient has been followed expectantly for elevated BP , HLD, Prediabetes and Vitamin D Deficiency.       HTN predates since     . Patient's BP has been controlled at home.  Today's  . Patient denies any cardiac symptoms as chest pain, palpitations, shortness of breath, dizziness or ankle swelling.       Patient's hyperlipidemia is controlled with diet and medications. Patient denies myalgias or other medication SE's. Last lipids were at goal except elevated Trig's :  Lab Results  Component Value Date   CHOL 179 05/16/2020   HDL 42 05/16/2020   LDLCALC not calculated 05/16/2020   TRIG 474 (H) 05/16/2020   CHOLHDL 4.3 05/16/2020         Patient is screened expectantly for glucose intolerance  and patient denies reactive hypoglycemic symptoms, visual blurring, diabetic polys or paresthesias. Last A1c was normal & at goal :   Lab Results  Component Value Date   HGBA1C 5.5 05/16/2020          Finally, patient has history of Vitamin D Deficiency ( "36" /2014 & "28"/Sept 2020) and last vitamin D was still low :   Lab Results  Component Value Date   VD25OH 35 05/16/2020   Physical Exam  There were no vitals taken for this visit.  General  Appearance: Well nourished and well groomed and in no apparent distress.  Eyes: PERRLA, EOMs, conjunctiva no swelling or erythema, normal fundi and vessels. Sinuses: No frontal/maxillary tenderness ENT/Mouth: EACs patent / TMs  nl. Nares clear without erythema, swelling, mucoid exudates. Oral hygiene is good. No erythema, swelling, or exudate. Tongue normal, non-obstructing. Tonsils not swollen or erythematous. Hearing normal.  Neck: Supple, thyroid not palpable. No bruits, nodes or JVD. Respiratory: Respiratory effort normal.  BS equal and clear bilateral without rales, rhonci, wheezing or stridor. Cardio: Heart sounds are normal with regular rate and  rhythm and no murmurs, rubs or gallops. Peripheral pulses are normal and equal bilaterally without edema. No aortic or femoral bruits. Chest: symmetric with normal excursions and percussion.  Abdomen: Soft, with Nl bowel sounds. Nontender, no guarding, rebound, hernias, masses, or organomegaly.  Lymphatics: Non tender without lymphadenopathy.  Musculoskeletal: Full ROM all peripheral extremities, joint stability, 5/5 strength, and normal gait. Skin: Warm and dry without rashes, lesions, cyanosis, clubbing or  ecchymosis.  Neuro: Cranial nerves intact, reflexes equal bilaterally. Normal muscle tone, no cerebellar symptoms. Sensation intact.  Pysch: Alert and oriented X 3 with normal affect, insight and judgment appropriate.   Assessment and Plan  1. Annual Preventative/Screening Exam    2. Elevated BP without diagnosis of hypertension  - EKG 12-Lead - Urinalysis, Routine w reflex microscopic - Microalbumin / creatinine urine ratio - CBC with Differential/Platelet - COMPLETE METABOLIC PANEL WITH GFR - Magnesium - TSH  3. Hyperlipidemia, mixed  - EKG 12-Lead - Lipid panel - TSH  4. Abnormal glucose  - EKG 12-Lead - Hemoglobin A1c - Insulin, random  5. Vitamin D deficiency  - VITAMIN D 25 Hydroxy   6. Screening for colorectal  cancer  - POC Hemoccult Bld/Stl   7. Screening examination for pulmonary tuberculosis  - TB Skin Test  8. Screening for ischemic heart disease  - EKG 12-Lead  9. Prostate cancer screening  - PSA  10. Fatigue  - Iron, Total/Total Iron Binding Cap - Vitamin B12 - Testosterone - CBC with Differential/Platelet - TSH  11. Medication management  - Urinalysis, Routine w reflex microscopic - Microalbumin / creatinine urine ratio - COMPLETE METABOLIC PANEL WITH GFR - Magnesium - Lipid panel - TSH - Hemoglobin A1c - Insulin, random - VITAMIN D 25 Hydroxy           Patient was counseled in prudent diet, weight control to achieve/maintain BMI less than 25, BP monitoring, regular exercise and medications as discussed.  Discussed med effects and SE's. Routine screening labs and tests as requested with regular follow-up as recommended. Over 40 minutes of exam, counseling, chart review and high complex critical decision making was performed   Marinus Maw, MD

## 2021-05-23 DIAGNOSIS — R5383 Other fatigue: Secondary | ICD-10-CM | POA: Insufficient documentation

## 2021-05-23 DIAGNOSIS — Z111 Encounter for screening for respiratory tuberculosis: Secondary | ICD-10-CM | POA: Insufficient documentation

## 2021-05-26 ENCOUNTER — Ambulatory Visit (INDEPENDENT_AMBULATORY_CARE_PROVIDER_SITE_OTHER): Payer: 59 | Admitting: Nurse Practitioner

## 2021-05-26 ENCOUNTER — Encounter: Payer: Self-pay | Admitting: Nurse Practitioner

## 2021-05-26 ENCOUNTER — Other Ambulatory Visit: Payer: Self-pay

## 2021-05-26 VITALS — BP 110/78 | HR 97 | Temp 97.5°F | Ht 63.0 in | Wt 171.2 lb

## 2021-05-26 DIAGNOSIS — J4540 Moderate persistent asthma, uncomplicated: Secondary | ICD-10-CM

## 2021-05-26 DIAGNOSIS — Z136 Encounter for screening for cardiovascular disorders: Secondary | ICD-10-CM | POA: Diagnosis not present

## 2021-05-26 DIAGNOSIS — N183 Chronic kidney disease, stage 3 unspecified: Secondary | ICD-10-CM

## 2021-05-26 DIAGNOSIS — R5383 Other fatigue: Secondary | ICD-10-CM

## 2021-05-26 DIAGNOSIS — E782 Mixed hyperlipidemia: Secondary | ICD-10-CM

## 2021-05-26 DIAGNOSIS — I10 Essential (primary) hypertension: Secondary | ICD-10-CM | POA: Diagnosis not present

## 2021-05-26 DIAGNOSIS — R7309 Other abnormal glucose: Secondary | ICD-10-CM

## 2021-05-26 DIAGNOSIS — Z23 Encounter for immunization: Secondary | ICD-10-CM | POA: Diagnosis not present

## 2021-05-26 DIAGNOSIS — Z79899 Other long term (current) drug therapy: Secondary | ICD-10-CM

## 2021-05-26 DIAGNOSIS — Z0000 Encounter for general adult medical examination without abnormal findings: Secondary | ICD-10-CM | POA: Diagnosis not present

## 2021-05-26 DIAGNOSIS — Z1389 Encounter for screening for other disorder: Secondary | ICD-10-CM

## 2021-05-26 DIAGNOSIS — R1013 Epigastric pain: Secondary | ICD-10-CM

## 2021-05-26 DIAGNOSIS — E559 Vitamin D deficiency, unspecified: Secondary | ICD-10-CM

## 2021-05-26 DIAGNOSIS — Z0001 Encounter for general adult medical examination with abnormal findings: Secondary | ICD-10-CM

## 2021-05-26 MED ORDER — ESOMEPRAZOLE MAGNESIUM 40 MG PO CPDR: 40.0000 mg | DELAYED_RELEASE_CAPSULE | Freq: Two times a day (BID) | ORAL | 0 refills | Status: DC

## 2021-05-26 MED ORDER — SUCRALFATE 1 G PO TABS: 1.0000 g | ORAL_TABLET | Freq: Four times a day (QID) | ORAL | 0 refills | Status: DC

## 2021-05-26 NOTE — Patient Instructions (Signed)

## 2021-05-26 NOTE — Progress Notes (Signed)
Complete Physical  Assessment and Plan:  Ricky Myers was seen today for annual exam.  Diagnoses and all orders for this visit:  Encounter for general adult medical examination with abnormal findings Due Yearly  Hyperlipidemia, mixed -     CBC with Differential/Platelet -     COMPLETE METABOLIC PANEL WITH GFR -     Lipid panel -     TSH -  Continue to focus on diet and exercise  Chronic renal failure, stage 3 (moderate), unspecified whether stage 3a or 3b CKD (HCC) -     COMPLETE METABOLIC PANEL WITH GFR -     Microalbumin / creatinine urine ratio  Continue to push fluids, decrease caffeine  Abnormal glucose -     Hemoglobin A1c Continue focusing on diet and exercise  Vitamin D deficiency -     VITAMIN D 25 Hydroxy (Vit-D Deficiency, Fractures) Not currently on Vit D supplement, may initiate pending results  Screening for ischemic heart disease -     EKG 12-Lead  Medication management -     Magnesium  Screening for hematuria or proteinuria -     Urinalysis, Routine w reflex microscopic -     Microalbumin / creatinine urine ratio  Flu vaccine need -     Flu Vaccine QUAD 6+ mos PF IM (Fluarix Quad PF)  Moderate persistent asthma without complication  Continue Singulair daily and Albuterol as needed.  Practice forced air breathing exercises  Class 2 severe obesity due to excess calories with serious comorbidity in adult, unspecified BMI ([place])  Long discussion on diet and exercise.  Limit red meat. Increase fresh fruits, vegetables and fiber. Practice regular exercise  Fatigue, unspecified type -     Testosterone  Epigastric pain -     sucralfate (CARAFATE) 1 g tablet; Take 1 tablet (1 g total) by mouth 4 (four) times daily for 14 days. -     esomeprazole (NEXIUM) 40 MG capsule; Take 1 capsule (40 mg total) by mouth 2 (two) times daily for 14 days. Strongly encouraged to decrease caffeine.  If symptoms are not improving with above regimen call the office          Discussed med's effects and SE's. Screening labs and tests as requested with regular follow-up as recommended. Over 40 minutes of exam, counseling, chart review and critical decision making was performed Future Appointments  Date Time Provider [place]  05/26/2022  3:00 PM Magda Bernheim, NP [facility] None    HPI Patient presents for a complete physical. has Asthma; Vitamin D deficiency; Screening for ischemic heart disease; BMI 26.67; Non-seasonal allergic rhinitis due to fungal spores; Elevated BP without diagnosis of hypertension; Hyperlipidemia, mixed; Abnormal glucose; Moderate persistent asthma; Gastroesophageal reflux disease; Cough; Screening examination for pulmonary tuberculosis; and Fatigue on their problem list.   His blood pressure has been controlled at home, today their BP is BP: 110/78 BP Readings from Last 3 Encounters:  05/26/21 110/78  01/13/21 90/64  07/01/20 110/62    When he takes a deep breath for the past month he will get a pain in the right side of his chest. He does have some symptoms of heartburn but will take TUMS when needed. Has a history of hiatal hernia as a child. He is on fiber supplement daily  Has a history of hemorrhoid, occasionally bleeds.   He is feeling more fatigues.  He works nights at [employer] and has done so for 12 years.  Feels like fatigue may be  worsening.   BMI is Body mass index is 30.33 kg/m., he has been working on diet and exercise. Wt Readings from Last 3 Encounters:  05/26/21 171 lb 3.2 oz (77.7 kg)  01/13/21 174 lb 6.4 oz (79.1 kg)  05/29/20 175 lb (79.4 kg)    He does workout. He denies chest pain, shortness of breath, dizziness.  He is not on cholesterol medication and denies myalgias. His cholesterol is not at goal. The cholesterol last visit was:   Lab Results  Component Value Date   CHOL 179 05/16/2020   HDL 42 05/16/2020   LDLCALC  05/16/2020     Comment:     . LDL cholesterol not calculated.  Triglyceride levels greater than 400 mg/dL invalidate calculated LDL results. . Reference range: <100 . Desirable range <100 mg/dL for primary prevention;   <70 mg/dL for patients with CHD or diabetic patients  with > or = 2 CHD risk factors. Marland Kitchen LDL-C is now calculated using the Martin-Hopkins  calculation, which is a validated novel method providing  better accuracy than the Friedewald equation in the  estimation of LDL-C.  Horald Pollen et al. Lenox Ahr. 5956;387(56): 2061-2068  (http://education.[facility].com/faq/FAQ164)    TRIG 474 (H) 05/16/2020   CHOLHDL 4.3 05/16/2020   He has been working on diet and exercise for abnormal glucose. Last A1C in the office was:  Lab Results  Component Value Date   HGBA1C 5.5 05/16/2020   Last GFR was decreased, is drinking more fluids. Has been drinking a lot of caffeine approx. 8 -10 days   Lab Results  Component Value Date   GFRNONAA 53 (L) 05/16/2020     Patient is on Vitamin D supplement.   Lab Results  Component Value Date   VD25OH 35 05/16/2020     Last PSA was: Lab Results  Component Value Date   PSA 0.17 05/16/2020    Current Medications:  Current Outpatient Medications on File Prior to Visit  Medication Sig Dispense Refill   acyclovir (ZOVIRAX) 400 MG tablet Take 400 mg by mouth daily.     finasteride (PROSCAR) 5 MG tablet TAKE 1 TABLET BY MOUTH DAILY 90 tablet 1   fluticasone (FLONASE) 50 MCG/ACT nasal spray Place 2 sprays into both nostrils daily as needed for allergies or rhinitis (for stuffy nose). 16 g 5   loratadine (CLARITIN) 10 MG tablet Take 10 mg by mouth daily.     montelukast (SINGULAIR) 10 MG tablet TAKE 1 TABLET BY MOUTH DAILY FOR ALLERGIES 90 tablet 1   albuterol (VENTOLIN HFA) 108 (90 Base) MCG/ACT inhaler Inhale 2 puffs into the lungs every 4 (four) hours as needed for wheezing or shortness of breath. (Patient not taking: Reported on 05/26/2021) 1 each 1   aspirin EC 81 MG tablet Take 81 mg by mouth daily.  (Patient not taking: Reported on 05/26/2021)     No current facility-administered medications on file prior to visit.   Allergies:  No Known Allergies Health Maintenance:  Immunization History  Administered Date(s) Administered   Influenza Inj Mdck Quad Pf 05/30/2018   Influenza Inj Mdck Quad With Preservative 05/16/2020   PFIZER(Purple Top)SARS-COV-2 Vaccination 01/07/2020, 01/26/2020   PPD Test 04/02/2014, 05/03/2015, 11/10/2016, 04/18/2019, 05/16/2020   Pneumococcal Polysaccharide-23 06/19/2012   Tdap 05/03/2015    Tetanus: 2016 Pneumovax:2013 Prevnar 13: Flu vaccine:05/16/20 Zostavax:N/A  DEXA: Colonoscopy:Due age 60 EGD: Eye Exam: 3 years ago Dentist: Appt scheduled 06/02/21  Patient Care Team: Lucky Cowboy, MD as PCP - General (Internal Medicine)  Medical  History:  has Asthma; Vitamin D deficiency; Screening for ischemic heart disease; BMI 26.67; Non-seasonal allergic rhinitis due to fungal spores; Elevated BP without diagnosis of hypertension; Hyperlipidemia, mixed; Abnormal glucose; Moderate persistent asthma; Gastroesophageal reflux disease; Cough; Screening examination for pulmonary tuberculosis; and Fatigue on their problem list. Surgical History:  He  has a past surgical history that includes no past surgery. Family History:  His family history includes Emphysema in his maternal grandmother. Social History:   reports that he has never smoked. He has never used smokeless tobacco. He reports current alcohol use. He reports that he does not use drugs. Review of Systems:  Review of Systems  Constitutional:  Positive for malaise/fatigue. Negative for chills and fever.  HENT:  Negative for congestion, hearing loss, sinus pain, sore throat and tinnitus.   Eyes:  Negative for blurred vision and double vision.  Respiratory:  Negative for cough, hemoptysis, sputum production, shortness of breath and wheezing.   Cardiovascular:  Positive for chest pain (Pain mid  sternal with deep breath or swallowing). Negative for palpitations and leg swelling.  Gastrointestinal:  Negative for abdominal pain, constipation, diarrhea, heartburn, nausea and vomiting.       Hemorrhoids, occasional bleeding  Genitourinary:  Negative for dysuria and urgency.  Musculoskeletal:  Negative for back pain, falls, joint pain, myalgias and neck pain.  Skin:  Negative for rash.  Neurological:  Negative for dizziness, tingling, tremors, weakness and headaches.  Endo/Heme/Allergies:  Does not bruise/bleed easily.  Psychiatric/Behavioral:  Negative for depression and suicidal ideas. The patient is not nervous/anxious and does not have insomnia.    Physical Exam: Estimated body mass index is 30.33 kg/m as calculated from the following:   Height as of this encounter: 5\' 3"  (1.6 m).   Weight as of this encounter: 171 lb 3.2 oz (77.7 kg). BP 110/78   Pulse 97   Temp (!) 97.5 F (36.4 C)   Ht 5\' 3"  (1.6 m)   Wt 171 lb 3.2 oz (77.7 kg)   SpO2 98%   BMI 30.33 kg/m  General Appearance: Well nourished, in no apparent distress.  Eyes: PERRLA, EOMs, conjunctiva no swelling or erythema, normal fundi and vessels.  Sinuses: No Frontal/maxillary tenderness  ENT/Mouth: Ext aud canals clear, normal light reflex with TMs without erythema, bulging. Good dentition. No erythema, swelling, or exudate on post pharynx. Tonsils not swollen or erythematous. Hearing normal.  Neck: Supple, thyroid normal. No bruits  Respiratory: Respiratory effort normal, BS equal bilaterally without rales, rhonchi, wheezing or stridor.  Cardio: RRR without murmurs, rubs or gallops. Brisk peripheral pulses without edema.  Chest: symmetric, with normal excursions and percussion.  Abdomen: Soft, nontender, no guarding, rebound, hernias, masses, or organomegaly.  Lymphatics: Non tender without lymphadenopathy.  Genitourinary: Deferred Musculoskeletal: Full ROM all peripheral extremities,5/5 strength, and normal gait.   Skin: Warm, dry without rashes, lesions, ecchymosis. Neuro: Cranial nerves intact, reflexes equal bilaterally. Normal muscle tone, no cerebellar symptoms. Sensation intact.  Psych: Awake and oriented X 3, normal affect, Insight and Judgment appropriate.   EKG: NSR, no ST changes   Ilayda Toda W Erilyn Pearman 3:06 PM [facility]

## 2021-05-27 LAB — COMPLETE METABOLIC PANEL WITH GFR
AG Ratio: 1.8 (calc) (ref 1.0–2.5)
ALT: 29 U/L (ref 9–46)
AST: 24 U/L (ref 10–40)
Albumin: 4.6 g/dL (ref 3.6–5.1)
Alkaline phosphatase (APISO): 79 U/L (ref 36–130)
BUN: 14 mg/dL (ref 7–25)
CO2: 29 mmol/L (ref 20–32)
Calcium: 9.9 mg/dL (ref 8.6–10.3)
Chloride: 102 mmol/L (ref 98–110)
Creat: 1.24 mg/dL (ref 0.60–1.29)
Globulin: 2.5 g/dL (calc) (ref 1.9–3.7)
Glucose, Bld: 90 mg/dL (ref 65–99)
Potassium: 4.3 mmol/L (ref 3.5–5.3)
Sodium: 138 mmol/L (ref 135–146)
Total Bilirubin: 0.6 mg/dL (ref 0.2–1.2)
Total Protein: 7.1 g/dL (ref 6.1–8.1)
eGFR: 75 mL/min/{1.73_m2} (ref >=60)

## 2021-05-27 LAB — CBC WITH DIFFERENTIAL/PLATELET
Absolute Monocytes: 639 cells/uL (ref 200–950)
Basophils Absolute: 57 cells/uL (ref 0–200)
Basophils Relative: 0.8 %
Eosinophils Absolute: 142 cells/uL (ref 15–500)
Eosinophils Relative: 2 %
HCT: 45.7 % (ref 38.5–50.0)
Hemoglobin: 15.8 g/dL (ref 13.2–17.1)
Lymphs Abs: 2180 cells/uL (ref 850–3900)
MCH: 31.7 pg (ref 27.0–33.0)
MCHC: 34.6 g/dL (ref 32.0–36.0)
MCV: 91.8 fL (ref 80.0–100.0)
MPV: 9 fL (ref 7.5–12.5)
Monocytes Relative: 9 %
Neutro Abs: 4083 cells/uL (ref 1500–7800)
Neutrophils Relative %: 57.5 %
Platelets: 253 10*3/uL (ref 140–400)
RBC: 4.98 10*6/uL (ref 4.20–5.80)
RDW: 12.9 % (ref 11.0–15.0)
Total Lymphocyte: 30.7 %
WBC: 7.1 10*3/uL (ref 3.8–10.8)

## 2021-05-27 LAB — MICROALBUMIN / CREATININE URINE RATIO
Creatinine, Urine: 59 mg/dL (ref 20–320)
Microalb, Ur: <0.2 mg/dL

## 2021-05-27 LAB — URINALYSIS, ROUTINE W REFLEX MICROSCOPIC
Bilirubin Urine: NEGATIVE
Glucose, UA: NEGATIVE
Hgb urine dipstick: NEGATIVE
Ketones, ur: NEGATIVE
Leukocytes,Ua: NEGATIVE
Nitrite: NEGATIVE
Protein, ur: NEGATIVE
Specific Gravity, Urine: 1.008 (ref 1.001–1.035)
pH: 7 (ref 5.0–8.0)

## 2021-05-27 LAB — LIPID PANEL
Cholesterol: 190 mg/dL (ref <200)
HDL: 47 mg/dL (ref >=40)
LDL Cholesterol (Calc): 109 mg/dL (calc) — ABNORMAL HIGH
Non-HDL Cholesterol (Calc): 143 mg/dL (calc) — ABNORMAL HIGH (ref <130)
Total CHOL/HDL Ratio: 4 (calc) (ref <5.0)
Triglycerides: 223 mg/dL — ABNORMAL HIGH (ref <150)

## 2021-05-27 LAB — HEMOGLOBIN A1C
Hgb A1c MFr Bld: 5.4 % of total Hgb (ref <5.7)
Mean Plasma Glucose: 108 mg/dL
eAG (mmol/L): 6 mmol/L

## 2021-05-27 LAB — TESTOSTERONE: Testosterone: 469 ng/dL (ref 250–827)

## 2021-05-27 LAB — MAGNESIUM: Magnesium: 2.3 mg/dL (ref 1.5–2.5)

## 2021-05-27 LAB — TSH: TSH: 4.22 mIU/L (ref 0.40–4.50)

## 2021-05-27 LAB — VITAMIN D 25 HYDROXY (VIT D DEFICIENCY, FRACTURES): Vit D, 25-Hydroxy: 40 ng/mL (ref 30–100)

## 2021-06-04 ENCOUNTER — Other Ambulatory Visit: Payer: Self-pay | Admitting: Family Medicine

## 2021-09-03 ENCOUNTER — Other Ambulatory Visit: Payer: Self-pay | Admitting: Adult Health

## 2021-09-18 ENCOUNTER — Other Ambulatory Visit: Payer: Self-pay | Admitting: Adult Health

## 2021-12-28 IMAGING — DX DG CHEST 2V
2 series · 2 of 2 positions shown · non-contrast
Comparison: None.

CLINICAL DATA: 39-year-old male with cough.

EXAM:
CHEST - 2 VIEW

[chest pa]
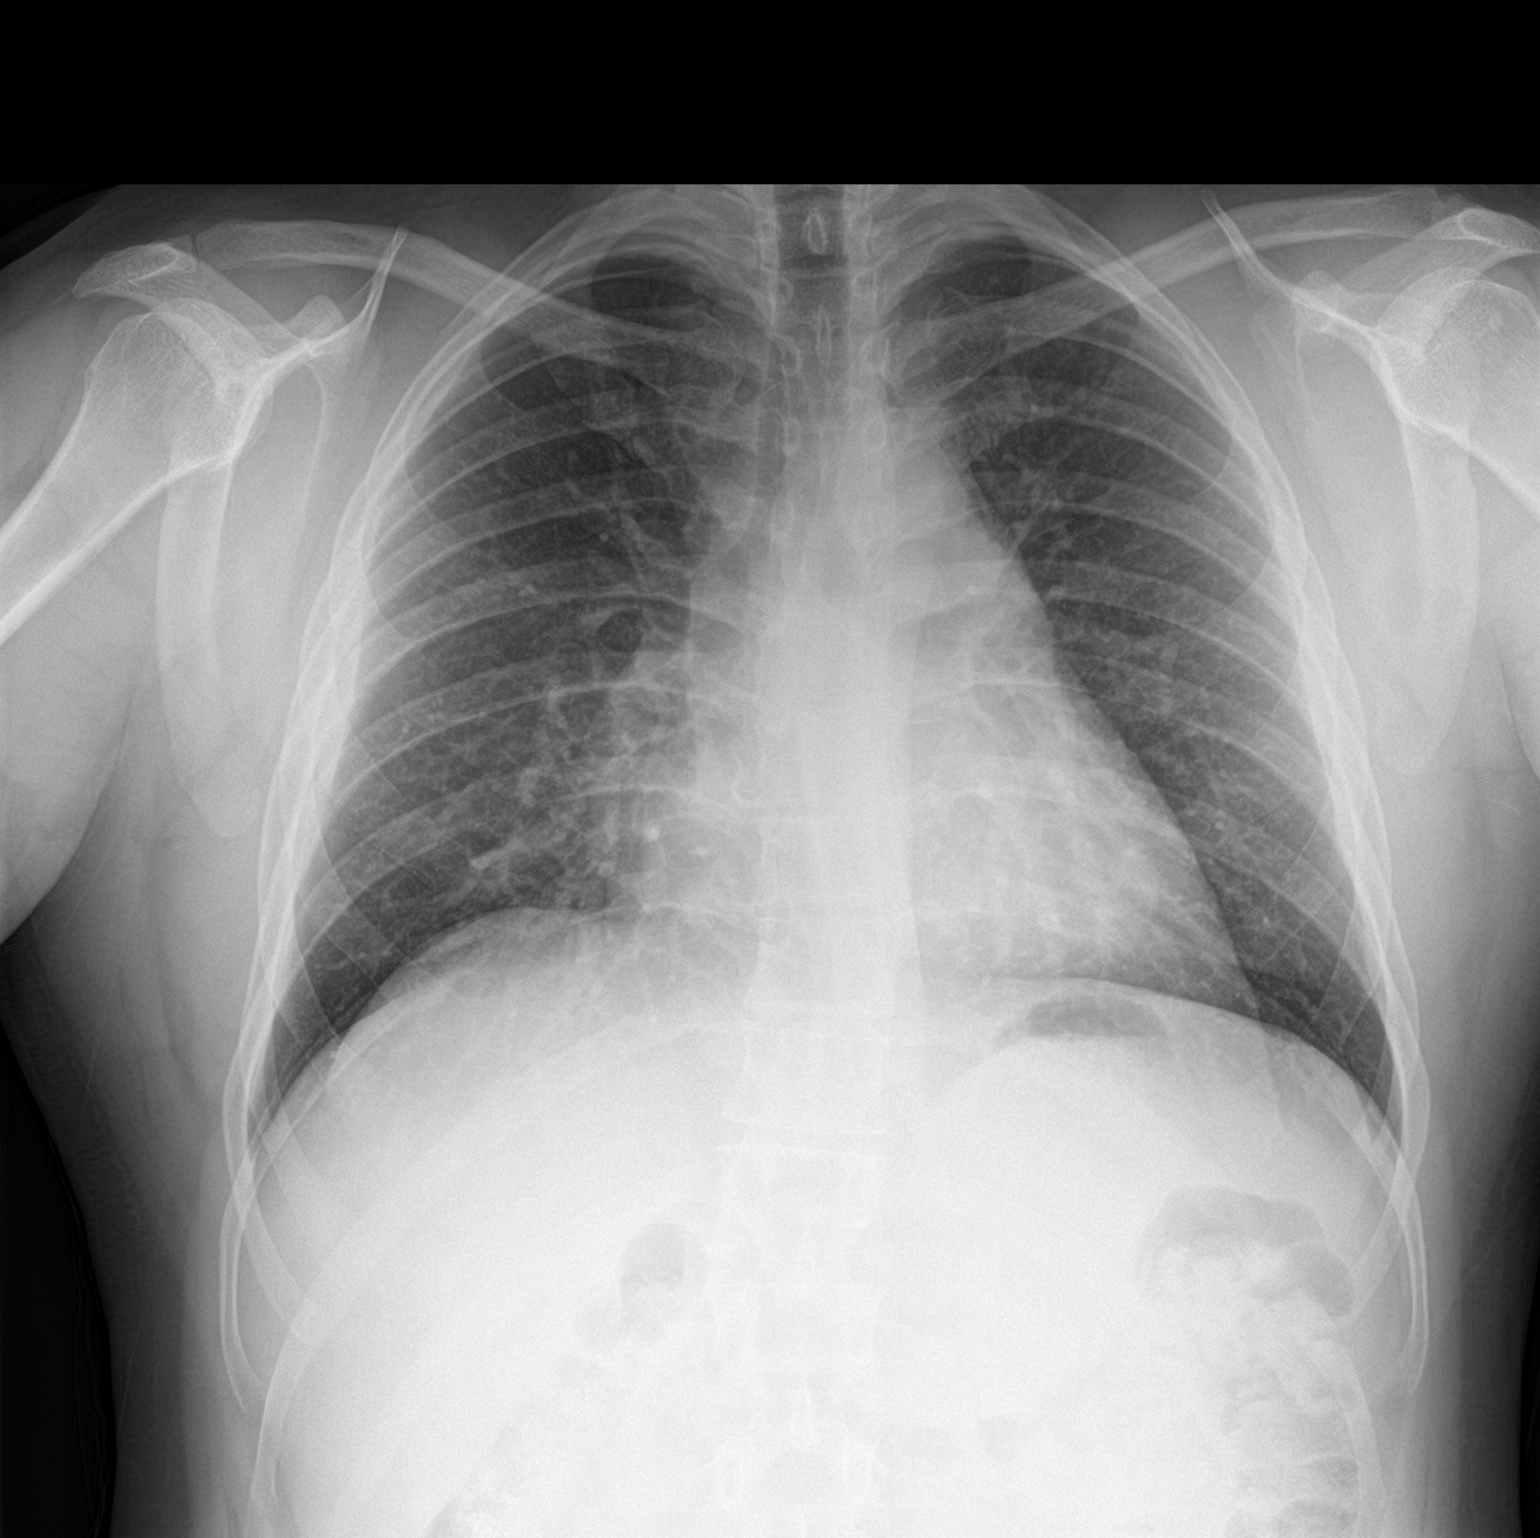

[chest lat]
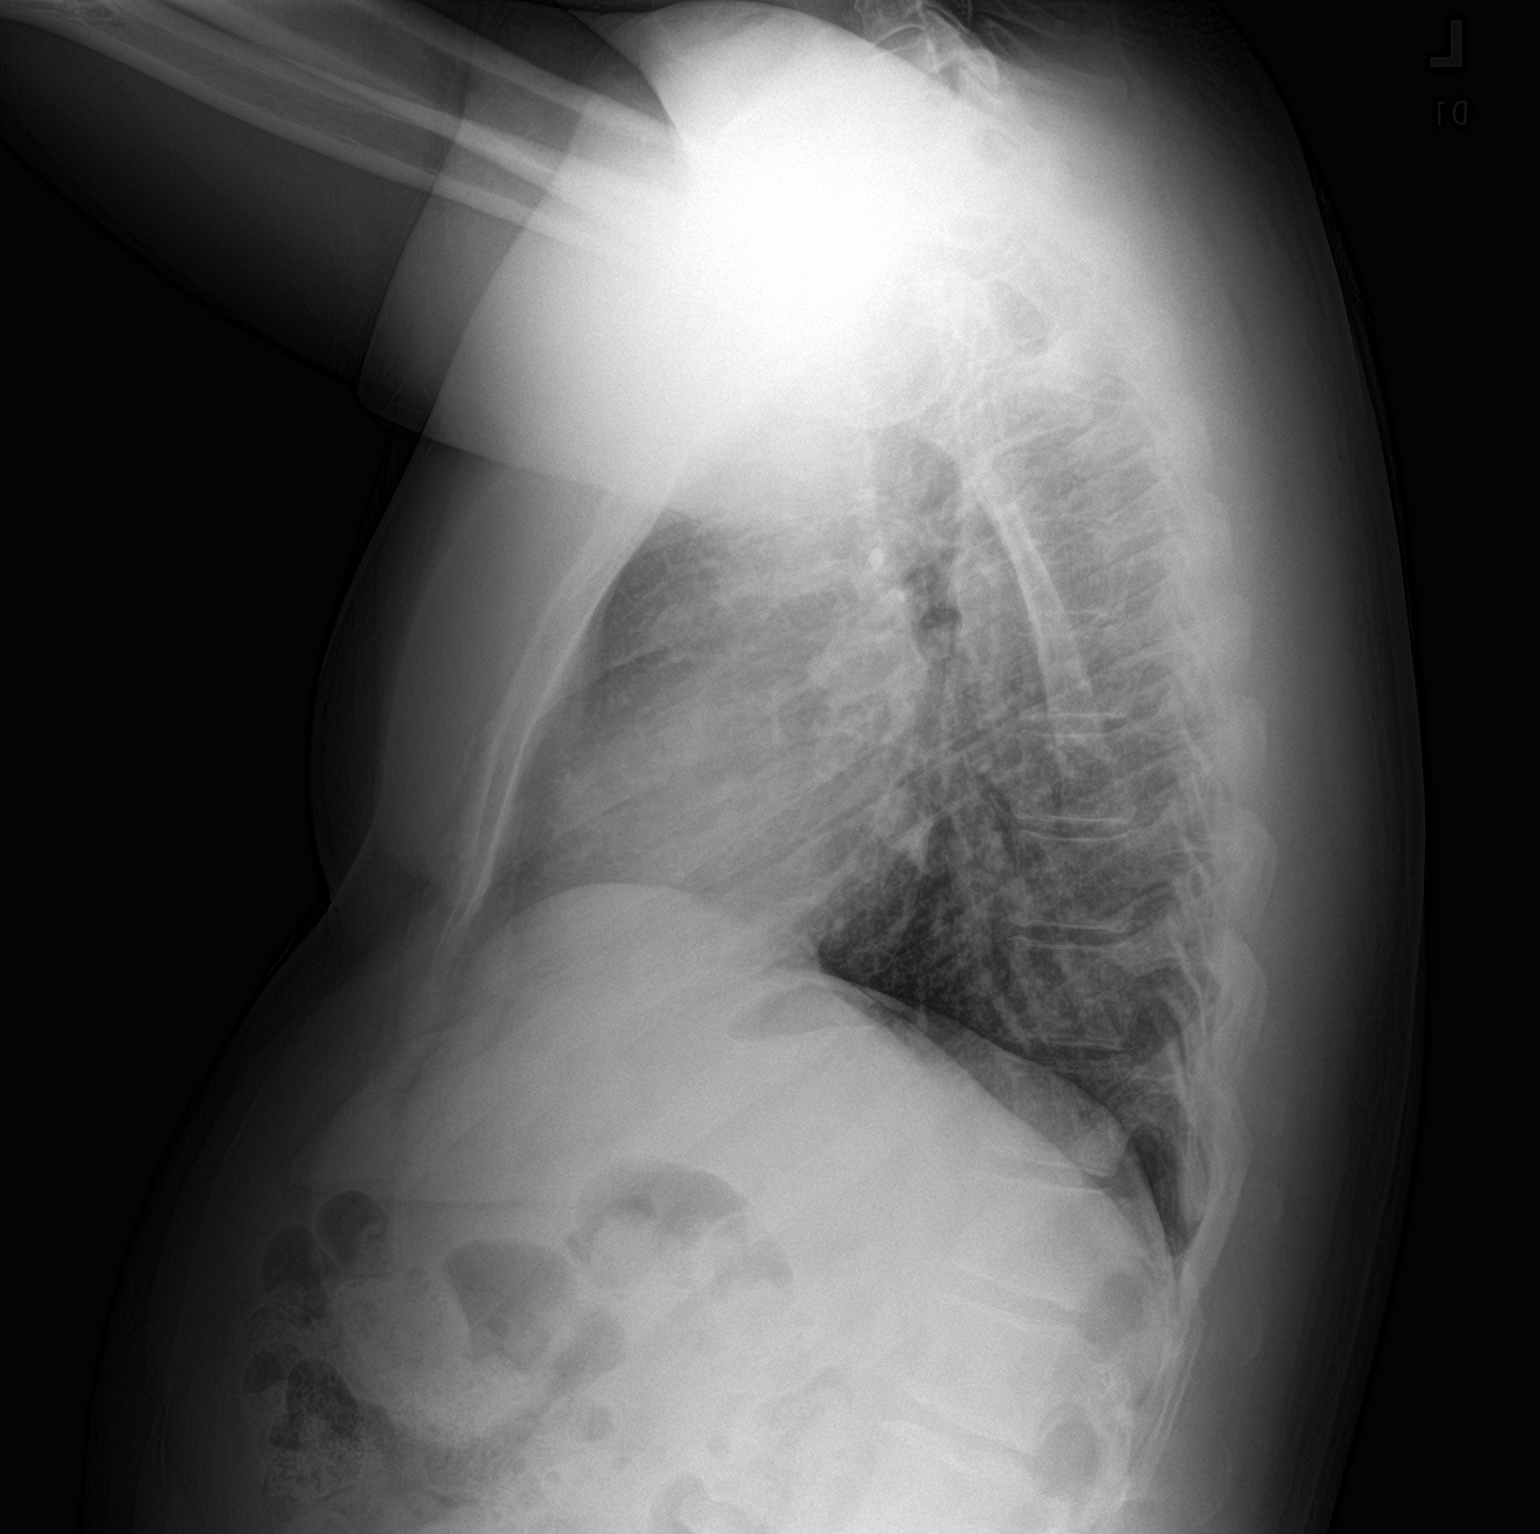

[2 of 2 positions shown; findings below may reference images not displayed]

FINDINGS: No focal consolidation, pleural effusion or pneumothorax. The
cardiac silhouette is within limits. No acute osseous pathology.
IMPRESSION: No active cardiopulmonary disease.

## 2022-03-04 ENCOUNTER — Other Ambulatory Visit: Payer: Self-pay

## 2022-03-04 MED ORDER — FINASTERIDE 5 MG PO TABS: ORAL_TABLET | ORAL | 1 refills | Status: DC

## 2022-04-03 ENCOUNTER — Other Ambulatory Visit: Payer: Self-pay | Admitting: Nurse Practitioner

## 2022-05-25 ENCOUNTER — Encounter: Payer: 59 | Admitting: Internal Medicine

## 2022-05-25 NOTE — Progress Notes (Unsigned)
Complete Physical  Assessment and Plan:  Ravis was seen today for annual exam.  Diagnoses and all orders for this visit:  Encounter for general adult medical examination with abnormal findings Due Yearly  Hyperlipidemia, mixed -     CBC with Differential/Platelet -     COMPLETE METABOLIC PANEL WITH GFR -     Lipid panel -     TSH -  Discussed strong adherence to diet vs statin use. Pt is very reluctant to use a statin and will work on diet and exercise, also discussed Coronary artery calcium score and may decide to do this in the future  CKD stage 2 -     COMPLETE METABOLIC PANEL WITH GFR -     Microalbumin / creatinine urine ratio  Continue to push fluids, decrease caffeine  Abnormal glucose -     Hemoglobin A1c Continue focusing on diet and exercise  Vitamin D deficiency -     VITAMIN D 25 Hydroxy (Vit-D Deficiency, Fractures) Not currently on Vit D supplement, may initiate pending results  Screening for ischemic heart disease -     EKG 12-Lead  Medication management -     Magnesium   Screening for thyroid disorder - TSH  Screening for AAA - U/S ABD RETROPERITONEAL LTD  Screening for hematuria or proteinuria -     Urinalysis, Routine w reflex microscopic -     Microalbumin / creatinine urine ratio  Flu vaccine need -     Flu Vaccine QUAD 6+ mos PF IM (Fluarix Quad PF)  Moderate persistent asthma without complication/Non-Seasonal allergies due to fungal spores  Continue Singulair daily and Albuterol as needed.  Practice forced air breathing exercises  Continue Flonase nasal spray  Class 2 severe obesity due to excess calories with serious comorbidity in adult, unspecified BMI ([place]) Long discussion about weight loss, diet, and exercise Recommended diet heavy in fruits and veggies and low in animal meats, cheeses, and dairy products, appropriate calorie intake Patient will work on decreasing saturated fats and simple carbs, continue exercise Follow up at next  visit  Epigastric pain/GERD Was using Nexium but only PRN so would rather use TUMS Continue behavior/dietary modifications   Pneumococcal vaccine need - Prevnar 20 given today  Fatigue - Testosterone   Discussed med's effects and SE's. Screening labs and tests as requested with regular follow-up as recommended. Over 40 minutes of exam, counseling, chart review and critical decision making was performed Future Appointments  Date Time Provider [place]  05/27/2023  3:00 PM Alycia Rossetti, NP [facility] None    HPI Patient presents for a complete physical. has Asthma; Vitamin D deficiency; Screening for ischemic heart disease; BMI 26.67; Non-seasonal allergic rhinitis due to fungal spores; Elevated BP without diagnosis of hypertension; Hyperlipidemia, mixed; Abnormal glucose; Moderate persistent asthma; Gastroesophageal reflux disease; Cough; Screening examination for pulmonary tuberculosis; and Fatigue on their problem list.   He continues to have difficulty sleeping and has a lot of fatigue.  Has switched to day shift and has made no difference.  He is concerned about low testosterone. He is exercising more and eating healthier and is still gaining weight.   His blood pressure has been controlled at home without medication, today their BP is BP: 100/64 BP Readings from Last 3 Encounters:  05/26/22 100/64  05/26/21 110/78  01/13/21 90/64    Nexium has been controlling heartburn symptoms as needed, uses TUMS intermittently.   Has a history of hiatal hernia as a child. He is  on fiber supplement daily  Has a history of hemorrhoid, occasionally bleeds.   BMI is Body mass index is 31.38 kg/m., he has been working on diet and exercise. He has gained 11 pounds since last year. He has been doing strength training for exercise.  He plans to start running because he has a 5K in December Wt Readings from Last 3 Encounters:  05/26/22 182 lb 12.8 oz (82.9 kg)  05/26/21 171 lb 3.2 oz  (77.7 kg)  01/13/21 174 lb 6.4 oz (79.1 kg)    He does workout. He denies chest pain, shortness of breath, dizziness.  He is not on cholesterol medication and denies myalgias. His cholesterol is not at goal. The cholesterol last visit was:   Lab Results  Component Value Date   CHOL 190 05/26/2021   HDL 47 05/26/2021   LDLCALC 109 (H) 05/26/2021   TRIG 223 (H) 05/26/2021   CHOLHDL 4.0 05/26/2021   He has been working on diet and exercise for abnormal glucose. Last A1C in the office was:  Lab Results  Component Value Date   HGBA1C 5.4 05/26/2021    GFR was decreased in 2021 but has returned to stage 2, is drinking more fluids. Has been drinking a lot of caffeine approx. 8 -10 days Lab Results  Component Value Date   EGFR 75 05/26/2021     Patient is  currently on Vitamin D supplement.   Lab Results  Component Value Date   VD25OH 40 05/26/2021     Last PSA was: Lab Results  Component Value Date   PSA 0.17 05/16/2020    Current Medications:  Current Outpatient Medications on File Prior to Visit  Medication Sig Dispense Refill   acyclovir (ZOVIRAX) 400 MG tablet Take 400 mg by mouth daily.     BREZTRI AEROSPHERE 160-9-4.8 MCG/ACT AERO INHALE 2 PUFFS INTO THE LUNGS TWICE DAILY 10.7 g 1   cetirizine (ZYRTEC ALLERGY) 10 MG tablet Take 10 mg by mouth daily.     finasteride (PROSCAR) 5 MG tablet TAKE 1 TABLET BY MOUTH EVERY DAY 90 tablet 1   fluticasone (FLONASE) 50 MCG/ACT nasal spray Place 2 sprays into both nostrils daily as needed for allergies or rhinitis (for stuffy nose). 16 g 5   loratadine (CLARITIN) 10 MG tablet Take 10 mg by mouth daily.     montelukast (SINGULAIR) 10 MG tablet TAKE 1 TABLET BY MOUTH DAILY FOR ALLERGIES 90 tablet 1   esomeprazole (NEXIUM) 40 MG capsule Take 1 capsule (40 mg total) by mouth 2 (two) times daily for 14 days. 28 capsule 0   sucralfate (CARAFATE) 1 g tablet Take 1 tablet (1 g total) by mouth 4 (four) times daily for 14 days. 56 tablet 0    No current facility-administered medications on file prior to visit.   Allergies:  No Known Allergies Health Maintenance:  Immunization History  Administered Date(s) Administered   Influenza Inj Mdck Quad Pf 05/30/2018   Influenza Inj Mdck Quad With Preservative 05/16/2020   Influenza,inj,Quad PF,6+ Mos 05/26/2021   PFIZER(Purple Top)SARS-COV-2 Vaccination 01/07/2020, 01/26/2020   PPD Test 04/02/2014, 05/03/2015, 11/10/2016, 04/18/2019, 05/16/2020   Pneumococcal Polysaccharide-23 06/19/2012   Tdap 05/03/2015    Tetanus: 2016 Pneumovax:2013 Prevnar 20: today Prevnar 13: Flu vaccine:today Zostavax:N/A  DEXA: Colonoscopy:Due age 75 EGD: Eye Exam: 3 years ago Dentist: Appt scheduled 06/02/21  Patient Care Team: Unk Pinto, MD as PCP - General (Internal Medicine)  Medical History:  has Asthma; Vitamin D deficiency; Screening for ischemic heart  disease; BMI 26.67; Non-seasonal allergic rhinitis due to fungal spores; Elevated BP without diagnosis of hypertension; Hyperlipidemia, mixed; Abnormal glucose; Moderate persistent asthma; Gastroesophageal reflux disease; Cough; Screening examination for pulmonary tuberculosis; and Fatigue on their problem list. Surgical History:  He  has a past surgical history that includes no past surgery. Family History:  His family history includes Emphysema in his maternal grandmother. Social History:   reports that he has never smoked. He has never used smokeless tobacco. He reports current alcohol use. He reports that he does not use drugs. Review of Systems:  Review of Systems  Constitutional:  Positive for malaise/fatigue. Negative for chills and fever.  HENT:  Negative for congestion, hearing loss, sinus pain, sore throat and tinnitus.   Eyes:  Negative for blurred vision and double vision.  Respiratory:  Negative for cough, hemoptysis, sputum production, shortness of breath and wheezing.   Cardiovascular:  Positive for chest pain  (Pain mid sternal with deep breath or swallowing). Negative for palpitations and leg swelling.  Gastrointestinal:  Negative for abdominal pain, constipation, diarrhea, heartburn, nausea and vomiting.       Hemorrhoids, occasional bleeding  Genitourinary:  Negative for dysuria and urgency.  Musculoskeletal:  Negative for back pain, falls, joint pain, myalgias and neck pain.  Skin:  Negative for rash.  Neurological:  Negative for dizziness, tingling, tremors, weakness and headaches.  Endo/Heme/Allergies:  Does not bruise/bleed easily.  Psychiatric/Behavioral:  Negative for depression and suicidal ideas. The patient is not nervous/anxious and does not have insomnia.     Physical Exam: Estimated body mass index is 31.38 kg/m as calculated from the following:   Height as of this encounter: 5' 4" (1.626 m).   Weight as of this encounter: 182 lb 12.8 oz (82.9 kg). BP 100/64   Pulse 84   Temp 97.9 F (36.6 C)   Ht 5' 4" (1.626 m)   Wt 182 lb 12.8 oz (82.9 kg)   SpO2 98%   BMI 31.38 kg/m  General Appearance: Well nourished, in no apparent distress.  Eyes: PERRLA, EOMs, conjunctiva no swelling or erythema, normal fundi and vessels.  Sinuses: No Frontal/maxillary tenderness  ENT/Mouth: Ext aud canals clear, normal light reflex with TMs without erythema, bulging. Good dentition. No erythema, swelling, or exudate on post pharynx. Tonsils not swollen or erythematous. Hearing normal.  Neck: Supple, thyroid normal. No bruits  Respiratory: Respiratory effort normal, BS equal bilaterally without rales, rhonchi, wheezing or stridor.  Cardio: RRR without murmurs, rubs or gallops. Brisk peripheral pulses without edema.  Chest: symmetric, with normal excursions and percussion.  Abdomen: Soft, nontender, no guarding, rebound, hernias, masses, or organomegaly.  Lymphatics: Non tender without lymphadenopathy.  Genitourinary: Deferred Musculoskeletal: Full ROM all peripheral extremities,5/5 strength, and  normal gait.  Skin: Warm, dry without rashes, lesions, ecchymosis. Neuro: Cranial nerves intact, reflexes equal bilaterally. Normal muscle tone, no cerebellar symptoms. Sensation intact.  Psych: Awake and oriented X 3, normal affect, Insight and Judgment appropriate.   EKG: NSR, no ST changes AAA: < 3 cm  Zianna Dercole E Montgomery Rothlisberger 3:23 PM [facility]

## 2022-05-26 ENCOUNTER — Ambulatory Visit (INDEPENDENT_AMBULATORY_CARE_PROVIDER_SITE_OTHER): Payer: 59 | Admitting: Nurse Practitioner

## 2022-05-26 ENCOUNTER — Encounter: Payer: Self-pay | Admitting: Nurse Practitioner

## 2022-05-26 VITALS — BP 100/64 | HR 84 | Temp 97.9°F | Ht 64.0 in | Wt 182.8 lb

## 2022-05-26 DIAGNOSIS — I700 Atherosclerosis of aorta: Secondary | ICD-10-CM | POA: Diagnosis not present

## 2022-05-26 DIAGNOSIS — R1013 Epigastric pain: Secondary | ICD-10-CM

## 2022-05-26 DIAGNOSIS — N183 Chronic kidney disease, stage 3 unspecified: Secondary | ICD-10-CM

## 2022-05-26 DIAGNOSIS — Z23 Encounter for immunization: Secondary | ICD-10-CM

## 2022-05-26 DIAGNOSIS — K219 Gastro-esophageal reflux disease without esophagitis: Secondary | ICD-10-CM

## 2022-05-26 DIAGNOSIS — Z0001 Encounter for general adult medical examination with abnormal findings: Secondary | ICD-10-CM

## 2022-05-26 DIAGNOSIS — E782 Mixed hyperlipidemia: Secondary | ICD-10-CM

## 2022-05-26 DIAGNOSIS — J3089 Other allergic rhinitis: Secondary | ICD-10-CM

## 2022-05-26 DIAGNOSIS — J4540 Moderate persistent asthma, uncomplicated: Secondary | ICD-10-CM

## 2022-05-26 DIAGNOSIS — E559 Vitamin D deficiency, unspecified: Secondary | ICD-10-CM

## 2022-05-26 DIAGNOSIS — I10 Essential (primary) hypertension: Secondary | ICD-10-CM | POA: Diagnosis not present

## 2022-05-26 DIAGNOSIS — Z1329 Encounter for screening for other suspected endocrine disorder: Secondary | ICD-10-CM

## 2022-05-26 DIAGNOSIS — Z136 Encounter for screening for cardiovascular disorders: Secondary | ICD-10-CM | POA: Diagnosis not present

## 2022-05-26 DIAGNOSIS — Z79899 Other long term (current) drug therapy: Secondary | ICD-10-CM

## 2022-05-26 DIAGNOSIS — N182 Chronic kidney disease, stage 2 (mild): Secondary | ICD-10-CM

## 2022-05-26 DIAGNOSIS — Z1389 Encounter for screening for other disorder: Secondary | ICD-10-CM

## 2022-05-26 DIAGNOSIS — E66812 Obesity, class 2: Secondary | ICD-10-CM

## 2022-05-26 DIAGNOSIS — Z0000 Encounter for general adult medical examination without abnormal findings: Secondary | ICD-10-CM

## 2022-05-26 DIAGNOSIS — R7309 Other abnormal glucose: Secondary | ICD-10-CM

## 2022-05-26 DIAGNOSIS — R5383 Other fatigue: Secondary | ICD-10-CM

## 2022-05-26 MED ORDER — FLUTICASONE PROPIONATE 50 MCG/ACT NA SUSP: 2.0000 | Freq: Every day | NASAL | 5 refills | Status: AC | PRN

## 2022-05-26 NOTE — Patient Instructions (Signed)

## 2022-05-27 ENCOUNTER — Encounter: Payer: Self-pay | Admitting: Nurse Practitioner

## 2022-05-27 ENCOUNTER — Other Ambulatory Visit: Payer: Self-pay | Admitting: Internal Medicine

## 2022-05-27 LAB — TESTOSTERONE: Testosterone: 401 ng/dL (ref 250–827)

## 2022-05-27 LAB — URINALYSIS, ROUTINE W REFLEX MICROSCOPIC
Bilirubin Urine: NEGATIVE
Glucose, UA: NEGATIVE
Hgb urine dipstick: NEGATIVE
Ketones, ur: NEGATIVE
Leukocytes,Ua: NEGATIVE
Nitrite: NEGATIVE
Protein, ur: NEGATIVE
Specific Gravity, Urine: 1.014 (ref 1.001–1.035)
pH: 7.5 (ref 5.0–8.0)

## 2022-05-27 LAB — CBC WITH DIFFERENTIAL/PLATELET
Absolute Monocytes: 656 cells/uL (ref 200–950)
Basophils Absolute: 62 cells/uL (ref 0–200)
Basophils Relative: 0.9 %
Eosinophils Absolute: 242 cells/uL (ref 15–500)
Eosinophils Relative: 3.5 %
HCT: 43.1 % (ref 38.5–50.0)
Hemoglobin: 15 g/dL (ref 13.2–17.1)
Lymphs Abs: 2498 cells/uL (ref 850–3900)
MCH: 32.2 pg (ref 27.0–33.0)
MCHC: 34.8 g/dL (ref 32.0–36.0)
MCV: 92.5 fL (ref 80.0–100.0)
MPV: 9 fL (ref 7.5–12.5)
Monocytes Relative: 9.5 %
Neutro Abs: 3443 cells/uL (ref 1500–7800)
Neutrophils Relative %: 49.9 %
Platelets: 222 10*3/uL (ref 140–400)
RBC: 4.66 10*6/uL (ref 4.20–5.80)
RDW: 13.1 % (ref 11.0–15.0)
Total Lymphocyte: 36.2 %
WBC: 6.9 10*3/uL (ref 3.8–10.8)

## 2022-05-27 LAB — COMPLETE METABOLIC PANEL WITH GFR
AG Ratio: 2 (calc) (ref 1.0–2.5)
ALT: 29 U/L (ref 9–46)
AST: 24 U/L (ref 10–40)
Albumin: 4.3 g/dL (ref 3.6–5.1)
Alkaline phosphatase (APISO): 76 U/L (ref 36–130)
BUN: 18 mg/dL (ref 7–25)
CO2: 27 mmol/L (ref 20–32)
Calcium: 8.6 mg/dL (ref 8.6–10.3)
Chloride: 103 mmol/L (ref 98–110)
Creat: 1.28 mg/dL (ref 0.60–1.29)
Globulin: 2.2 g/dL (calc) (ref 1.9–3.7)
Glucose, Bld: 90 mg/dL (ref 65–99)
Potassium: 4 mmol/L (ref 3.5–5.3)
Sodium: 138 mmol/L (ref 135–146)
Total Bilirubin: 0.4 mg/dL (ref 0.2–1.2)
Total Protein: 6.5 g/dL (ref 6.1–8.1)
eGFR: 72 mL/min/{1.73_m2} (ref >=60)

## 2022-05-27 LAB — TSH: TSH: 4.25 mIU/L (ref 0.40–4.50)

## 2022-05-27 LAB — LIPID PANEL
Cholesterol: 207 mg/dL — ABNORMAL HIGH (ref <200)
HDL: 45 mg/dL (ref >=40)
Non-HDL Cholesterol (Calc): 162 mg/dL (calc) — ABNORMAL HIGH (ref <130)
Total CHOL/HDL Ratio: 4.6 (calc) (ref <5.0)
Triglycerides: 627 mg/dL — ABNORMAL HIGH (ref <150)

## 2022-05-27 LAB — MICROALBUMIN / CREATININE URINE RATIO
Creatinine, Urine: 83 mg/dL (ref 20–320)
Microalb, Ur: <0.2 mg/dL

## 2022-05-27 LAB — HEMOGLOBIN A1C
Hgb A1c MFr Bld: 5.9 % of total Hgb — ABNORMAL HIGH (ref <5.7)
Mean Plasma Glucose: 123 mg/dL
eAG (mmol/L): 6.8 mmol/L

## 2022-05-27 LAB — MAGNESIUM: Magnesium: 2.3 mg/dL (ref 1.5–2.5)

## 2022-05-27 LAB — VITAMIN D 25 HYDROXY (VIT D DEFICIENCY, FRACTURES): Vit D, 25-Hydroxy: 63 ng/mL (ref 30–100)

## 2022-05-27 MED ORDER — TRETINOIN 0.05 % EX CREA: TOPICAL_CREAM | Freq: Every day | CUTANEOUS | 99 refills | Status: AC

## 2022-06-17 ENCOUNTER — Ambulatory Visit: Payer: 59 | Admitting: Internal Medicine

## 2022-06-17 ENCOUNTER — Encounter: Payer: Self-pay | Admitting: Internal Medicine

## 2022-06-17 VITALS — BP 108/60 | HR 85 | Temp 98.8°F | Resp 17 | Ht 64.35 in | Wt 177.4 lb

## 2022-06-17 DIAGNOSIS — J4540 Moderate persistent asthma, uncomplicated: Secondary | ICD-10-CM

## 2022-06-17 DIAGNOSIS — J3089 Other allergic rhinitis: Secondary | ICD-10-CM

## 2022-06-17 DIAGNOSIS — K219 Gastro-esophageal reflux disease without esophagitis: Secondary | ICD-10-CM | POA: Diagnosis not present

## 2022-06-17 NOTE — Patient Instructions (Addendum)
Asthma Breathing tests showed inflammation in your lungs that was partially reversible  Restart Breztri 2 puffs twice a day with a spacer to prevent cough or wheeze.  Continue montelukast 10 mg once a day to prevent cough or wheeze Continue albuterol 2 puffs every 4 hours as needed for cough or wheeze You may use albuterol 2 puffs 5-15 minutes before activity to reduce cough or wheeze  Allergic rhinitis Continue allergen avoidance measures directed toward mold as listed below Continue cetirizine 10 mg once a day as needed for a runny nose or itch.  Continue Flonase 2 sprays in each nostril once a day as needed for a stuffy nose Consider saline nasal rinses as needed for nasal symptoms. Use this before any medicated nasal sprays for best result For thick post nasal drainage, begin Mucinex [phone] mg twice a day and increase fluid intake to thin mucus  Reflux Continue dietary and lifestyle modifications as listed below  Follow up:  3 months   Thank you so much for letting me partake in your care today.  Don't hesitate to reach out if you have any additional concerns!  Ferol Luz, MD  [facility] [place], [place]

## 2022-06-17 NOTE — Progress Notes (Unsigned)
Follow Up Note  RE: Ricky Myers MRN: [id number] DOB: 1980-08-09 Date of Office Visit: 06/17/2022  Referring provider: Lucky Cowboy, MD Primary care provider: Lucky Cowboy, MD  Chief Complaint: No chief complaint on file.  History of Present Illness: I had the pleasure of seeing Ricky Myers for a follow up visit at the [facility] on 06/17/2022. He is a 41 y.o. male, who is being followed for asthma, allergic rhinitis, reflux . His previous allergy office visit was on 01/13/21 with Ricky Leyland, FNP. Today is a  acute visit for worsening cough  .  History obtained from patient, chart review and .  Asthma  -worsening cough, chest tightness since OCT 25th.  He testing negative to COVID, he improved however cough worsened.  - Worsens with with speaking  - Breztri 2 puffs twice daily (only getting 6/28 puffs per week) - Montelukast 10mg  daily   Assessment and Plan: Ricky Myers is a 41 y.o. male with: No diagnosis found. Plan: There are no Patient Instructions on file for this visit. No follow-ups on file.  No orders of the defined types were placed in this encounter.   Lab Orders  No laboratory test(s) ordered today   Diagnostics: Spirometry:  Tracings reviewed. His effort: {Blank single:19197::"Good reproducible efforts.","It was hard to get consistent efforts and there is a question as to whether this reflects a maximal maneuver.","Poor effort, data can not be interpreted."} FVC: ***L FEV1: ***L, ***% predicted FEV1/FVC ratio: ***% Interpretation: {Blank single:19197::"Spirometry consistent with mild obstructive disease","Spirometry consistent with moderate obstructive disease","Spirometry consistent with severe obstructive disease","Spirometry consistent with possible restrictive disease","Spirometry consistent with mixed obstructive and restrictive disease","Spirometry uninterpretable due to technique","Spirometry consistent with normal pattern","No overt  abnormalities noted given today's efforts"}.  Please see scanned spirometry results for details.  Skin Testing: {Blank single:19197::"Select foods","Environmental allergy panel","Environmental allergy panel and select foods","Food allergy panel","None","Deferred due to recent antihistamines use"}. *** Results interpreted by myself during this encounter and discussed with patient/family.   Medication List:  Current Outpatient Medications  Medication Sig Dispense Refill   acyclovir (ZOVIRAX) 400 MG tablet Take 400 mg by mouth daily.     BREZTRI AEROSPHERE 160-9-4.8 MCG/ACT AERO INHALE 2 PUFFS INTO THE LUNGS TWICE DAILY 10.7 g 1   cetirizine (ZYRTEC ALLERGY) 10 MG tablet Take 10 mg by mouth daily.     finasteride (PROSCAR) 5 MG tablet TAKE 1 TABLET BY MOUTH EVERY DAY 90 tablet 1   fluticasone (FLONASE) 50 MCG/ACT nasal spray Place 2 sprays into both nostrils daily as needed for allergies or rhinitis (for stuffy nose). 16 g 5   loratadine (CLARITIN) 10 MG tablet Take 10 mg by mouth daily.     montelukast (SINGULAIR) 10 MG tablet TAKE 1 TABLET BY MOUTH DAILY FOR ALLERGIES 90 tablet 1   tretinoin (RETIN-A) 0.05 % cream Apply topically at bedtime. Apply topically at bedtime. 20 g 99   No current facility-administered medications for this visit.   Allergies: No Known Allergies I reviewed his past medical history, social history, family history, and environmental history and no significant changes have been reported from his previous visit.  ROS: All others negative except as noted per HPI.   Objective: There were no vitals taken for this visit. There is no height or weight on file to calculate BMI. General Appearance:  Alert, cooperative, no distress, appears stated age  Head:  Normocephalic, without obvious abnormality, atraumatic  Eyes:  Conjunctiva clear, EOM's intact  Nose: Nares normal, {Blank  multiple:19196:a:"***","hypertrophic turbinates","normal mucosa","no visible anterior  polyps","septum midline"}  Throat: Lips, tongue normal; teeth and gums normal, {Blank multiple:19196:a:"***","normal posterior oropharynx","tonsils 2+","tonsils 3+","no tonsillar exudate","+ cobblestoning"}  Neck: Supple, symmetrical  Lungs:   {Blank multiple:19196:a:"***","clear to auscultation bilaterally","end-expiratory wheezing","wheezing throughout"}, Respirations unlabored, {Blank multiple:19196:a:"***","no coughing","intermittent dry coughing"}  Heart:  {Blank multiple:19196:a:"***","regular rate and rhythm","no murmur"}, Appears well perfused  Extremities: No edema  Skin: Skin color, texture, turgor normal, no rashes or lesions on visualized portions of skin {Blank multiple:19196:a:""***"}  Neurologic: No gross deficits   Previous notes and tests were reviewed. The plan was reviewed with the patient/family, and all questions/concerned were addressed.  It was my pleasure to see Ricky Myers today and participate in his care. Please feel free to contact me with any questions or concerns.  Sincerely,  Ricky Luz, MD  Allergy & Immunology  [facility] Office: [phone]

## 2022-06-18 MED ORDER — BREZTRI AEROSPHERE 160-9-4.8 MCG/ACT IN AERO: 2.0000 | INHALATION_SPRAY | Freq: Two times a day (BID) | RESPIRATORY_TRACT | 5 refills | Status: DC

## 2022-06-24 ENCOUNTER — Other Ambulatory Visit: Payer: Self-pay | Admitting: Nurse Practitioner

## 2022-06-24 ENCOUNTER — Other Ambulatory Visit: Payer: Self-pay | Admitting: Internal Medicine

## 2022-06-24 DIAGNOSIS — R1013 Epigastric pain: Secondary | ICD-10-CM

## 2022-09-16 ENCOUNTER — Other Ambulatory Visit: Payer: Self-pay | Admitting: Nurse Practitioner

## 2022-10-16 ENCOUNTER — Other Ambulatory Visit: Payer: Self-pay | Admitting: Nurse Practitioner

## 2022-10-20 ENCOUNTER — Ambulatory Visit: Payer: 59 | Admitting: Internal Medicine

## 2022-10-28 ENCOUNTER — Ambulatory Visit: Payer: 59 | Admitting: Internal Medicine

## 2022-10-28 ENCOUNTER — Encounter: Payer: Self-pay | Admitting: Internal Medicine

## 2022-10-28 VITALS — BP 114/64 | HR 85 | Temp 98.2°F | Resp 18 | Wt 176.3 lb

## 2022-10-28 DIAGNOSIS — J4540 Moderate persistent asthma, uncomplicated: Secondary | ICD-10-CM | POA: Diagnosis not present

## 2022-10-28 DIAGNOSIS — K219 Gastro-esophageal reflux disease without esophagitis: Secondary | ICD-10-CM

## 2022-10-28 DIAGNOSIS — J3089 Other allergic rhinitis: Secondary | ICD-10-CM

## 2022-10-28 MED ORDER — CETIRIZINE HCL 10 MG PO TABS: 10.0000 mg | ORAL_TABLET | Freq: Every day | ORAL | 6 refills | Status: AC

## 2022-10-28 MED ORDER — BREZTRI AEROSPHERE 160-9-4.8 MCG/ACT IN AERO: 2.0000 | INHALATION_SPRAY | Freq: Two times a day (BID) | RESPIRATORY_TRACT | 5 refills | Status: DC

## 2022-10-28 NOTE — Progress Notes (Signed)
Follow Up Note  RE: Ricky Myers MRN: [id number] DOB: 04-28-1981 Date of Office Visit: 10/28/2022  Referring provider: Lucky Cowboy, MD Primary care provider: Lucky Cowboy, MD  Chief Complaint: Follow-up (Pt states that he has been struggling with his allergies which in terms have been affecting his breathing. But he's also not taking his Breztri as often as he should.), Asthma, and Allergic Rhinitis   History of Present Illness: I had the pleasure of seeing Ricky Myers for a follow up visit at the [facility] on 10/29/2022. He is a 42 y.o. male, who is being followed for persistent asthma, allergic rhinitis, reflux. His previous allergy office visit was on 02/14/2022 with Dr. Marlynn Perking. Today is a regular follow up visit.  History obtained from patient, chart review .  Since last visit he is lost 15 pounds with changes to diet.  He is only getting 10 out of 28 puffs of Breztri a week.  He has been compliant with montelukast.  He has had increased cough over the last few weeks.  He is interested in increasing aerobic exercise but asthma is limiting.  His rhinitis is well-controlled.  Denies any rhinorrhea, nasal congestion, red itchy eyes.  He is not taking cetirizine or Flonase.  Denies any GERD  Pertinent History/Diagnostics:  - Asthma: moderate persistent, c/b noncompliance, triggered by speaking  - mixed pattern spirometry (06/17/22): ratio 84, 2.17L, 65% FEV1 (pre), no pBD response   - AEC (05/26/22) 242,  - Allergic Rhinitis:   - SPT environmental panel (12/21/2017): mold -GERD: previously on omeprazole, not controlled with diet     Assessment and Plan: Ricky Myers is a 42 y.o. male with: Not well controlled moderate persistent asthma - Plan: Spirometry with Graph  Other allergic rhinitis  Gastroesophageal reflux disease without esophagitis   Plan: Patient Instructions  Asthma; moderate persistent, not well controlled  Breathing tests showed inflammation   Restart Breztri 2 puffs twice a day with a spacer to prevent cough or wheeze.  Continue montelukast 10 mg once a day to prevent cough or wheeze Continue albuterol 2 puffs every 4 hours as needed for cough or wheeze You may use albuterol 2 puffs 5-15 minutes before activity to reduce cough or wheeze Start pulmonary rehab at home  -exercise on treadmill 30 minutes daily as tolerated to increase pulmonary health   Allergic rhinitis Continue allergen avoidance measures directed toward mold  Continue cetirizine 10 mg once a day as needed for a runny nose or itch.  Continue Flonase 2 sprays in each nostril once a day as needed for a stuffy nose Consider saline nasal rinses as needed for nasal symptoms. Use this before any medicated nasal sprays for best result For thick post nasal drainage, begin Mucinex [phone] mg twice a day and increase fluid intake to thin mucus  Reflux Continue dietary and lifestyle modifications as listed below  Follow up:  6 months   Thank you so much for letting me partake in your care today.  Don't hesitate to reach out if you have any additional concerns!  Ferol Luz, MD  [facility] [place] [place]   Meds ordered this encounter  Medications   Budeson-Glycopyrrol-Formoterol (BREZTRI AEROSPHERE) 160-9-4.8 MCG/ACT AERO    Sig: Inhale 2 puffs into the lungs 2 (two) times daily.    Dispense:  32.1 g    Refill:  5   cetirizine (ZYRTEC ALLERGY) 10 MG tablet    Sig: Take 1 tablet (10 mg  total) by mouth daily.    Dispense:  32 tablet    Refill:  6    Lab Orders  No laboratory test(s) ordered today   Diagnostics: Spirometry:  Tracings reviewed. His effort: Good reproducible efforts. FVC: 2.88 L FEV1: 2.44 L, 71% predicted FEV1/FVC ratio: 85% Interpretation: Spirometry consistent with possible restrictive disease.  After 4 puffs of albuterol FEV1 decreased by 40 cc and 1%, FVC increased by 40 cc and 2%.  Is not severe postbronchodilator  response Please see scanned spirometry results for details.   Medication List:  Current Outpatient Medications  Medication Sig Dispense Refill   acyclovir (ZOVIRAX) 400 MG tablet Take 400 mg by mouth daily.     finasteride (PROSCAR) 5 MG tablet TAKE 1 TABLET BY MOUTH EVERY DAY 90 tablet 1   fluticasone (FLONASE) 50 MCG/ACT nasal spray Place 2 sprays into both nostrils daily as needed for allergies or rhinitis (for stuffy nose). 16 g 5   minoxidil (LONITEN) 2.5 MG tablet Take 2.5 mg by mouth daily.     montelukast (SINGULAIR) 10 MG tablet TAKE 1 TABLET BY MOUTH DAILY FOR ALLERGIES 90 tablet 1   omeprazole (PRILOSEC) 20 MG capsule Take by mouth.     tretinoin (RETIN-A) 0.05 % cream Apply topically at bedtime. Apply topically at bedtime. 20 g 99   Budeson-Glycopyrrol-Formoterol (BREZTRI AEROSPHERE) 160-9-4.8 MCG/ACT AERO Inhale 2 puffs into the lungs 2 (two) times daily. 32.1 g 5   cetirizine (ZYRTEC ALLERGY) 10 MG tablet Take 1 tablet (10 mg total) by mouth daily. 32 tablet 6   No current facility-administered medications for this visit.   Allergies: No Known Allergies I reviewed his past medical history, social history, family history, and environmental history and no significant changes have been reported from his previous visit.  ROS: All others negative except as noted per HPI.   Objective: BP 114/64   Pulse 85   Temp 98.2 F (36.8 C) (Temporal)   Resp 18   Wt 176 lb 4.8 oz (80 kg)   SpO2 97%   BMI 29.93 kg/m  Body mass index is 29.93 kg/m. General Appearance:  Alert, cooperative, no distress, appears stated age  Head:  Normocephalic, without obvious abnormality, atraumatic  Eyes:  Conjunctiva clear, EOM's intact  Nose: Nares normal, normal mucosa, no visible anterior polyps, and septum midline  Throat: Lips, tongue normal; teeth and gums normal, normal posterior oropharynx  Neck: Supple, symmetrical  Lungs:   clear to auscultation bilaterally, Respirations unlabored, no  coughing  Heart:  regular rate and rhythm and no murmur, Appears well perfused  Extremities: No edema  Skin: Skin color, texture, turgor normal, no rashes or lesions on visualized portions of skin  Neurologic: No gross deficits   Previous notes and tests were reviewed. The plan was reviewed with the patient/family, and all questions/concerned were addressed.  It was my pleasure to see Ricky Myers today and participate in his care. Please feel free to contact me with any questions or concerns.  Sincerely,  Ferol Luz, MD  Allergy & Immunology  [facility] Office: [phone]

## 2022-10-28 NOTE — Patient Instructions (Addendum)
Asthma; moderate persistent, not well controlled  Breathing tests showed inflammation  Restart Breztri 2 puffs twice a day with a spacer to prevent cough or wheeze.  Continue montelukast 10 mg once a day to prevent cough or wheeze Continue albuterol 2 puffs every 4 hours as needed for cough or wheeze You may use albuterol 2 puffs 5-15 minutes before activity to reduce cough or wheeze Start pulmonary rehab at home  -exercise on treadmill 30 minutes daily as tolerated to increase pulmonary health   Allergic rhinitis Continue allergen avoidance measures directed toward mold  Continue cetirizine 10 mg once a day as needed for a runny nose or itch.  Continue Flonase 2 sprays in each nostril once a day as needed for a stuffy nose Consider saline nasal rinses as needed for nasal symptoms. Use this before any medicated nasal sprays for best result For thick post nasal drainage, begin Mucinex [phone] mg twice a day and increase fluid intake to thin mucus  Reflux Continue dietary and lifestyle modifications as listed below  Follow up:  6 months   Thank you so much for letting me partake in your care today.  Don't hesitate to reach out if you have any additional concerns!  Ferol Luz, MD  [facility] [place] [place]

## 2022-11-24 ENCOUNTER — Ambulatory Visit: Payer: 59 | Admitting: Nurse Practitioner

## 2023-03-03 ENCOUNTER — Other Ambulatory Visit: Payer: Self-pay | Admitting: Internal Medicine

## 2023-04-04 ENCOUNTER — Other Ambulatory Visit: Payer: Self-pay | Admitting: Nurse Practitioner

## 2023-04-27 ENCOUNTER — Ambulatory Visit: Payer: 59 | Admitting: Internal Medicine

## 2023-04-30 ENCOUNTER — Encounter: Payer: Self-pay | Admitting: Internal Medicine

## 2023-04-30 ENCOUNTER — Ambulatory Visit: Payer: 59 | Admitting: Internal Medicine

## 2023-04-30 VITALS — BP 122/68 | HR 82 | Temp 97.9°F | Resp 18

## 2023-04-30 DIAGNOSIS — J3089 Other allergic rhinitis: Secondary | ICD-10-CM | POA: Diagnosis not present

## 2023-04-30 DIAGNOSIS — Z23 Encounter for immunization: Secondary | ICD-10-CM | POA: Diagnosis not present

## 2023-04-30 DIAGNOSIS — K219 Gastro-esophageal reflux disease without esophagitis: Secondary | ICD-10-CM | POA: Diagnosis not present

## 2023-04-30 DIAGNOSIS — J4540 Moderate persistent asthma, uncomplicated: Secondary | ICD-10-CM | POA: Diagnosis not present

## 2023-04-30 NOTE — Addendum Note (Signed)
Addended by: Maryjean Morn D on: 04/30/2023 02:07 PM   Modules accepted: Orders
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
## 2023-04-30 NOTE — Patient Instructions (Addendum)
Asthma; moderate persistent, not well controlled  Breathing tests showed inflammation, but is improved from prior  We will get labs to evaluate for biologics  Flu vaccine given today Recommend getting pneumovax and RSV vaccine   Continue Breztri 2 puffs twice a day with a spacer to prevent cough or wheeze.  Continue montelukast 10 mg once a day to prevent cough or wheeze Continue albuterol 2 puffs every 4 hours as needed for cough or wheeze You may use albuterol 2 puffs 5-15 minutes before activity to reduce cough or wheeze   Allergic rhinitis Continue allergen avoidance measures directed toward mold  Continue cetirizine 10 mg once a day as needed for a runny nose or itch.  Continue Flonase 2 sprays in each nostril once a day as needed for a stuffy nose Consider saline nasal rinses as needed for nasal symptoms. Use this before any medicated nasal sprays for best result For thick post nasal drainage, begin Mucinex [phone] mg twice a day and increase fluid intake to thin mucus  Reflux Continue dietary and lifestyle modifications as listed below  Follow up:  3 months   Thank you so much for letting me partake in your care today.  Don't hesitate to reach out if you have any additional concerns!  Ferol Luz, MD  [facility] [place] [place]

## 2023-05-03 LAB — CBC WITH DIFF/PLATELET
Basophils Absolute: 0.1 x10E3/uL (ref 0.0–0.2)
Basos: 1 %
EOS (ABSOLUTE): 0.1 x10E3/uL (ref 0.0–0.4)
Eos: 1 %
Hematocrit: 50.5 % (ref 37.5–51.0)
Hemoglobin: 17.1 g/dL (ref 13.0–17.7)
Immature Grans (Abs): 0.1 x10E3/uL (ref 0.0–0.1)
Immature Granulocytes: 1 %
Lymphocytes Absolute: 1.6 x10E3/uL (ref 0.7–3.1)
Lymphs: 22 %
MCH: 32.1 pg (ref 26.6–33.0)
MCHC: 33.9 g/dL (ref 31.5–35.7)
MCV: 95 fL (ref 79–97)
Monocytes Absolute: 0.7 x10E3/uL (ref 0.1–0.9)
Monocytes: 9 %
Neutrophils Absolute: 4.9 x10E3/uL (ref 1.4–7.0)
Neutrophils: 66 %
Platelets: 242 x10E3/uL (ref 150–450)
RBC: 5.32 x10E6/uL (ref 4.14–5.80)
RDW: 12.8 % (ref 11.6–15.4)
WBC: 7.3 x10E3/uL (ref 3.4–10.8)

## 2023-05-03 LAB — IGE: IgE (Immunoglobulin E), Serum: 25 [IU]/mL (ref 6–495)

## 2023-05-09 ENCOUNTER — Other Ambulatory Visit: Payer: Self-pay | Admitting: Nurse Practitioner

## 2023-05-17 NOTE — Progress Notes (Signed)
Ricky Myers may be an option for patient for asthma if we continue to have difficulty getting control.  Can someone let patient know?  Thanks!

## 2023-05-27 ENCOUNTER — Encounter: Payer: 59 | Admitting: Nurse Practitioner

## 2023-06-09 NOTE — Progress Notes (Deleted)
Complete Physical  Assessment and Plan:  Ricky Myers was seen today for annual exam.  Diagnoses and all orders for this visit:  Encounter for general adult medical examination with abnormal findings Due Yearly  Hyperlipidemia, mixed -     CBC with Differential/Platelet -     COMPLETE METABOLIC PANEL WITH GFR -     Lipid panel -     TSH -  Discussed strong adherence to diet vs statin use. Pt is very reluctant to use a statin and will work on diet and exercise, also discussed Coronary artery calcium score and may decide to do this in the future  CKD stage 2 -     COMPLETE METABOLIC PANEL WITH GFR -     Microalbumin / creatinine urine ratio  Continue to push fluids, decrease caffeine  Abnormal glucose -     Hemoglobin A1c Continue focusing on diet and exercise  Vitamin D deficiency -     VITAMIN D 25 Hydroxy (Vit-D Deficiency, Fractures) Not currently on Vit D supplement, may initiate pending results  Screening for ischemic heart disease -     EKG 12-Lead  Medication management -     Magnesium   Screening for thyroid disorder - TSH  Screening for hematuria or proteinuria -     Urinalysis, Routine w reflex microscopic -     Microalbumin / creatinine urine ratio  Flu vaccine need -     Flu Vaccine QUAD 6+ mos PF IM (Fluarix Quad PF)  Moderate persistent asthma without complication/Non-Seasonal allergies due to fungal spores  Continue Singulair daily and Albuterol as needed.  Practice forced air breathing exercises  Continue Flonase nasal spray  Class 2 severe obesity due to excess calories with serious comorbidity in adult, unspecified BMI (HCC) Long discussion about weight loss, diet, and exercise Recommended diet heavy in fruits and veggies and low in animal meats, cheeses, and dairy products, appropriate calorie intake Patient will work on decreasing saturated fats and simple carbs, continue exercise Follow up at next visit  GERD Was using Nexium but only PRN so  would rather use TUMS Continue behavior/dietary modifications       Discussed med's effects and SE's. Screening labs and tests as requested with regular follow-up as recommended. Over 40 minutes of exam, counseling, chart review and critical decision making was performed Future Appointments  Date Time Provider Department Center  06/10/2023 10:00 AM Raynelle Dick, NP [facility] None  08/02/2023  3:15 PM Ferol Luz, MD [facility] None  06/12/2024 10:00 AM Raynelle Dick, NP [facility] None    HPI Patient presents for a complete physical. has Asthma; Vitamin D deficiency; Screening for ischemic heart disease; BMI 26.67; Non-seasonal allergic rhinitis due to fungal spores; Elevated BP without diagnosis of hypertension; Hyperlipidemia, mixed; Abnormal glucose; Moderate persistent asthma; Gastroesophageal reflux disease; Cough; Screening examination for pulmonary tuberculosis; and Fatigue on their problem list.   He continues to have difficulty sleeping and has a lot of fatigue.  Has switched to day shift and has made no difference.  He is concerned about low testosterone. He is exercising more and eating healthier and is still gaining weight.   His blood pressure has been controlled at home without medication, today their BP is   BP Readings from Last 3 Encounters:  04/30/23 122/68  10/28/22 114/64  06/17/22 108/60  Denies headaches, chest pain, shortness of breath and dizziness   Nexium has been controlling heartburn symptoms as needed, uses TUMS intermittently.   Has a history  of hiatal hernia as a child. He is on fiber supplement daily  Has a history of hemorrhoid, occasionally bleeds.   BMI is There is no height or weight on file to calculate BMI., he has been working on diet and exercise. He has gained 11 pounds since last year. He has been doing strength training for exercise.  He plans to start running because he has a 5K in December Wt Readings from Last 3 Encounters:   10/28/22 176 lb 4.8 oz (80 kg)  06/17/22 177 lb 6.4 oz (80.5 kg)  05/26/22 182 lb 12.8 oz (82.9 kg)    He does workout. He denies chest pain, shortness of breath, dizziness.  He is not on cholesterol medication and denies myalgias. His cholesterol is not at goal. The cholesterol last visit was:   Lab Results  Component Value Date   CHOL 207 (H) 05/26/2022   HDL 45 05/26/2022   LDLCALC  05/26/2022     Comment:     . LDL cholesterol not calculated. Triglyceride levels greater than 400 mg/dL invalidate calculated LDL results. . Reference range: <100 . Desirable range <100 mg/dL for primary prevention;   <70 mg/dL for patients with CHD or diabetic patients  with > or = 2 CHD risk factors. Marland Kitchen LDL-C is now calculated using the Martin-Hopkins  calculation, which is a validated novel method providing  better accuracy than the Friedewald equation in the  estimation of LDL-C.  Horald Pollen et al. Lenox Ahr. 1610;960(45): 2061-2068  (http://education.[facility].com/faq/FAQ164)    TRIG 627 (H) 05/26/2022   CHOLHDL 4.6 05/26/2022   He has been working on diet and exercise for abnormal glucose. Last A1C in the office was:  Lab Results  Component Value Date   HGBA1C 5.9 (H) 05/26/2022    GFR was decreased in 2021 but has returned to stage 2, is drinking more fluids. Has been drinking a lot of caffeine approx. 8 -10 days Lab Results  Component Value Date   EGFR 72 05/26/2022     Patient is  currently on Vitamin D supplement.   Lab Results  Component Value Date   VD25OH 63 05/26/2022     Last PSA was: Lab Results  Component Value Date   PSA 0.17 05/16/2020    Current Medications:  Current Outpatient Medications on File Prior to Visit  Medication Sig Dispense Refill   acyclovir (ZOVIRAX) 400 MG tablet Take 400 mg by mouth daily.     Budeson-Glycopyrrol-Formoterol (BREZTRI AEROSPHERE) 160-9-4.8 MCG/ACT AERO INHALE 2 PUFFS INTO THE LUNGS TWICE DAILY 10.7 g 3   cetirizine  (ZYRTEC ALLERGY) 10 MG tablet Take 1 tablet (10 mg total) by mouth daily. 32 tablet 6   finasteride (PROSCAR) 5 MG tablet TAKE 1 TABLET BY MOUTH EVERY DAY 90 tablet 1   fluticasone (FLONASE) 50 MCG/ACT nasal spray Place 2 sprays into both nostrils daily as needed for allergies or rhinitis (for stuffy nose). 16 g 5   minoxidil (LONITEN) 2.5 MG tablet Take 2.5 mg by mouth daily.     montelukast (SINGULAIR) 10 MG tablet TAKE 1 TABLET BY MOUTH DAILY FOR ALLERGIES 90 tablet 1   omeprazole (PRILOSEC) 20 MG capsule Take by mouth.     tretinoin (RETIN-A) 0.05 % cream Apply topically at bedtime. Apply topically at bedtime. 20 g 99   No current facility-administered medications on file prior to visit.   Allergies:  No Known Allergies Health Maintenance:  Immunization History  Administered Date(s) Administered   Influenza Inj Mdck Quad Pf  05/30/2018   Influenza Inj Mdck Quad With Preservative 05/16/2020   Influenza, Seasonal, Injecte, Preservative Fre 04/30/2023   Influenza,inj,Quad PF,6+ Mos 05/26/2021, 05/26/2022   PFIZER(Purple Top)SARS-COV-2 Vaccination 01/07/2020, 01/26/2020   PNEUMOCOCCAL CONJUGATE-20 05/26/2022   PPD Test 04/02/2014, 05/03/2015, 11/10/2016, 04/18/2019, 05/16/2020   Pneumococcal Polysaccharide-23 06/19/2012   Tdap 05/03/2015    Tetanus: 2016 Pneumovax:2013 Prevnar 20: today Prevnar 13: Flu vaccine:today Zostavax:N/A  DEXA: Colonoscopy:Due age 1 EGD: Eye Exam: 3 years ago Dentist: Appt scheduled 06/02/21  Patient Care Team: Lucky Cowboy, MD as PCP - General (Internal Medicine)  Medical History:  has Asthma; Vitamin D deficiency; Screening for ischemic heart disease; BMI 26.67; Non-seasonal allergic rhinitis due to fungal spores; Elevated BP without diagnosis of hypertension; Hyperlipidemia, mixed; Abnormal glucose; Moderate persistent asthma; Gastroesophageal reflux disease; Cough; Screening examination for pulmonary tuberculosis; and Fatigue on their  problem list. Surgical History:  He  has a past surgical history that includes no past surgery. Family History:  His family history includes Emphysema in his maternal grandmother. Social History:   reports that he has never smoked. He has never been exposed to tobacco smoke. He has never used smokeless tobacco. He reports current alcohol use. He reports that he does not use drugs. Review of Systems:  Review of Systems  Constitutional:  Positive for malaise/fatigue. Negative for chills and fever.  HENT:  Negative for congestion, hearing loss, sinus pain, sore throat and tinnitus.   Eyes:  Negative for blurred vision and double vision.  Respiratory:  Negative for cough, hemoptysis, sputum production, shortness of breath and wheezing.   Cardiovascular:  Positive for chest pain (Pain mid sternal with deep breath or swallowing). Negative for palpitations and leg swelling.  Gastrointestinal:  Negative for abdominal pain, constipation, diarrhea, heartburn, nausea and vomiting.       Hemorrhoids, occasional bleeding  Genitourinary:  Negative for dysuria and urgency.  Musculoskeletal:  Negative for back pain, falls, joint pain, myalgias and neck pain.  Skin:  Negative for rash.  Neurological:  Negative for dizziness, tingling, tremors, weakness and headaches.  Endo/Heme/Allergies:  Does not bruise/bleed easily.  Psychiatric/Behavioral:  Negative for depression and suicidal ideas. The patient is not nervous/anxious and does not have insomnia.     Physical Exam: Estimated body mass index is 29.93 kg/m as calculated from the following:   Height as of 06/17/22: 5' 4.35" (1.634 m).   Weight as of 10/28/22: 176 lb 4.8 oz (80 kg). There were no vitals taken for this visit. General Appearance: Well nourished, in no apparent distress.  Eyes: PERRLA, EOMs, conjunctiva no swelling or erythema, normal fundi and vessels.  Sinuses: No Frontal/maxillary tenderness  ENT/Mouth: Ext aud canals clear, normal  light reflex with TMs without erythema, bulging. Good dentition. No erythema, swelling, or exudate on post pharynx. Tonsils not swollen or erythematous. Hearing normal.  Neck: Supple, thyroid normal. No bruits  Respiratory: Respiratory effort normal, BS equal bilaterally without rales, rhonchi, wheezing or stridor.  Cardio: RRR without murmurs, rubs or gallops. Brisk peripheral pulses without edema.  Chest: symmetric, with normal excursions and percussion.  Abdomen: Soft, nontender, no guarding, rebound, hernias, masses, or organomegaly.  Lymphatics: Non tender without lymphadenopathy.  Genitourinary: Deferred Musculoskeletal: Full ROM all peripheral extremities,5/5 strength, and normal gait.  Skin: Warm, dry without rashes, lesions, ecchymosis. Neuro: Cranial nerves intact, reflexes equal bilaterally. Normal muscle tone, no cerebellar symptoms. Sensation intact.  Psych: Awake and oriented X 3, normal affect, Insight and Judgment appropriate.   EKG: NSR, no ST changes  AAA: < 3 cm  Ricky Myers  12:10 PM [facility]

## 2023-06-10 ENCOUNTER — Encounter: Payer: 59 | Admitting: Nurse Practitioner

## 2023-06-23 NOTE — Progress Notes (Unsigned)
Complete Physical  Assessment and Plan:  Truxton was seen today for annual exam.  Diagnoses and all orders for this visit:  Encounter for general adult medical examination with abnormal findings Due Yearly  Hyperlipidemia, mixed -     CBC with Differential/Platelet -     COMPLETE METABOLIC PANEL WITH GFR -     Lipid panel -     TSH -  Discussed strong adherence to diet vs statin use. Pt is very reluctant to use a statin and will work on diet and exercise, also discussed Coronary artery calcium score and may decide to do this in the future  CKD stage 2 -     COMPLETE METABOLIC PANEL WITH GFR -     Microalbumin / creatinine urine ratio  Continue to push fluids, decrease caffeine  Abnormal glucose -     Hemoglobin A1c Continue focusing on diet and exercise  Vitamin D deficiency -     VITAMIN D 25 Hydroxy (Vit-D Deficiency, Fractures) Not currently on Vit D supplement, may initiate pending results  Screening for ischemic heart disease -     EKG 12-Lead  Medication management -     Magnesium   Screening for thyroid disorder - TSH  Screening for hematuria or proteinuria -     Urinalysis, Routine w reflex microscopic -     Microalbumin / creatinine urine ratio  Flu vaccine need -     Flu Vaccine QUAD 6+ mos PF IM (Fluarix Quad PF)  Moderate persistent asthma without complication/Non-Seasonal allergies due to fungal spores  Continue Singulair daily and Albuterol as needed.  Practice forced air breathing exercises  Continue Flonase nasal spray  Class 2 severe obesity due to excess calories with serious comorbidity in adult, unspecified BMI (HCC) Long discussion about weight loss, diet, and exercise Recommended diet heavy in fruits and veggies and low in animal meats, cheeses, and dairy products, appropriate calorie intake Patient will work on decreasing saturated fats and simple carbs, continue exercise Follow up at next visit  GERD Was using Nexium but only PRN so  would rather use TUMS Continue behavior/dietary modifications       Discussed med's effects and SE's. Screening labs and tests as requested with regular follow-up as recommended. Over 40 minutes of exam, counseling, chart review and critical decision making was performed Future Appointments  Date Time Provider Department Center  06/24/2023 11:00 AM Raynelle Dick, NP [facility] None  08/02/2023  3:15 PM Ferol Luz, MD [facility] None  06/23/2024 10:00 AM Raynelle Dick, NP [facility] None    HPI Patient presents for a complete physical. has Asthma; Vitamin D deficiency; Screening for ischemic heart disease; BMI 26.67; Non-seasonal allergic rhinitis due to fungal spores; Elevated BP without diagnosis of hypertension; Hyperlipidemia, mixed; Abnormal glucose; Moderate persistent asthma; Gastroesophageal reflux disease; Cough; Screening examination for pulmonary tuberculosis; and Fatigue on their problem list.   He continues to have difficulty sleeping and has a lot of fatigue.  Has switched to day shift and has made no difference.  He is concerned about low testosterone. He is exercising more and eating healthier and is still gaining weight.   His blood pressure has been controlled at home without medication, today their BP is   BP Readings from Last 3 Encounters:  04/30/23 122/68  10/28/22 114/64  06/17/22 108/60  Denies headaches, chest pain, shortness of breath and dizziness   Nexium has been controlling heartburn symptoms as needed, uses TUMS intermittently.   Has a history  of hiatal hernia as a child. He is on fiber supplement daily  Has a history of hemorrhoid, occasionally bleeds.   BMI is There is no height or weight on file to calculate BMI., he has been working on diet and exercise. He has gained 11 pounds since last year. He has been doing strength training for exercise.  He plans to start running because he has a 5K in December Wt Readings from Last 3 Encounters:   10/28/22 176 lb 4.8 oz (80 kg)  06/17/22 177 lb 6.4 oz (80.5 kg)  05/26/22 182 lb 12.8 oz (82.9 kg)    He does workout. He denies chest pain, shortness of breath, dizziness.  He is not on cholesterol medication and denies myalgias. His cholesterol is not at goal. The cholesterol last visit was:   Lab Results  Component Value Date   CHOL 207 (H) 05/26/2022   HDL 45 05/26/2022   LDLCALC  05/26/2022     Comment:     . LDL cholesterol not calculated. Triglyceride levels greater than 400 mg/dL invalidate calculated LDL results. . Reference range: <100 . Desirable range <100 mg/dL for primary prevention;   <70 mg/dL for patients with CHD or diabetic patients  with > or = 2 CHD risk factors. Marland Kitchen LDL-C is now calculated using the Martin-Hopkins  calculation, which is a validated novel method providing  better accuracy than the Friedewald equation in the  estimation of LDL-C.  Horald Pollen et al. Lenox Ahr. 1610;960(45): 2061-2068  (http://education.[facility].com/faq/FAQ164)    TRIG 627 (H) 05/26/2022   CHOLHDL 4.6 05/26/2022   He has been working on diet and exercise for abnormal glucose. Last A1C in the office was:  Lab Results  Component Value Date   HGBA1C 5.9 (H) 05/26/2022    GFR was decreased in 2021 but has returned to stage 2, is drinking more fluids. Has been drinking a lot of caffeine approx. 8 -10 days Lab Results  Component Value Date   EGFR 72 05/26/2022     Patient is  currently on Vitamin D supplement.   Lab Results  Component Value Date   VD25OH 63 05/26/2022     Last PSA was: Lab Results  Component Value Date   PSA 0.17 05/16/2020    Current Medications:  Current Outpatient Medications on File Prior to Visit  Medication Sig Dispense Refill   acyclovir (ZOVIRAX) 400 MG tablet Take 400 mg by mouth daily.     Budeson-Glycopyrrol-Formoterol (BREZTRI AEROSPHERE) 160-9-4.8 MCG/ACT AERO INHALE 2 PUFFS INTO THE LUNGS TWICE DAILY 10.7 g 3   cetirizine  (ZYRTEC ALLERGY) 10 MG tablet Take 1 tablet (10 mg total) by mouth daily. 32 tablet 6   finasteride (PROSCAR) 5 MG tablet TAKE 1 TABLET BY MOUTH EVERY DAY 90 tablet 1   fluticasone (FLONASE) 50 MCG/ACT nasal spray Place 2 sprays into both nostrils daily as needed for allergies or rhinitis (for stuffy nose). 16 g 5   minoxidil (LONITEN) 2.5 MG tablet Take 2.5 mg by mouth daily.     montelukast (SINGULAIR) 10 MG tablet TAKE 1 TABLET BY MOUTH DAILY FOR ALLERGIES 90 tablet 1   omeprazole (PRILOSEC) 20 MG capsule Take by mouth.     tretinoin (RETIN-A) 0.05 % cream Apply topically at bedtime. Apply topically at bedtime. 20 g 99   No current facility-administered medications on file prior to visit.   Allergies:  No Known Allergies Health Maintenance:  Immunization History  Administered Date(s) Administered   Influenza Inj Mdck Quad Pf  05/30/2018   Influenza Inj Mdck Quad With Preservative 05/16/2020   Influenza, Seasonal, Injecte, Preservative Fre 04/30/2023   Influenza,inj,Quad PF,6+ Mos 05/26/2021, 05/26/2022   PFIZER(Purple Top)SARS-COV-2 Vaccination 01/07/2020, 01/26/2020   PNEUMOCOCCAL CONJUGATE-20 05/26/2022   PPD Test 04/02/2014, 05/03/2015, 11/10/2016, 04/18/2019, 05/16/2020   Pneumococcal Polysaccharide-23 06/19/2012   Tdap 05/03/2015    Tetanus: 2016 Pneumovax:2013 Prevnar 20: today Prevnar 13: Flu vaccine:today Zostavax:N/A  DEXA: Colonoscopy:Due age 34 EGD: Eye Exam: 3 years ago Dentist: Appt scheduled 06/02/21  Patient Care Team: Lucky Cowboy, MD as PCP - General (Internal Medicine)  Medical History:  has Asthma; Vitamin D deficiency; Screening for ischemic heart disease; BMI 26.67; Non-seasonal allergic rhinitis due to fungal spores; Elevated BP without diagnosis of hypertension; Hyperlipidemia, mixed; Abnormal glucose; Moderate persistent asthma; Gastroesophageal reflux disease; Cough; Screening examination for pulmonary tuberculosis; and Fatigue on their  problem list. Surgical History:  He  has a past surgical history that includes no past surgery. Family History:  His family history includes Emphysema in his maternal grandmother. Social History:   reports that he has never smoked. He has never been exposed to tobacco smoke. He has never used smokeless tobacco. He reports current alcohol use. He reports that he does not use drugs. Review of Systems:  Review of Systems  Constitutional:  Positive for malaise/fatigue. Negative for chills and fever.  HENT:  Negative for congestion, hearing loss, sinus pain, sore throat and tinnitus.   Eyes:  Negative for blurred vision and double vision.  Respiratory:  Negative for cough, hemoptysis, sputum production, shortness of breath and wheezing.   Cardiovascular:  Positive for chest pain (Pain mid sternal with deep breath or swallowing). Negative for palpitations and leg swelling.  Gastrointestinal:  Negative for abdominal pain, constipation, diarrhea, heartburn, nausea and vomiting.       Hemorrhoids, occasional bleeding  Genitourinary:  Negative for dysuria and urgency.  Musculoskeletal:  Negative for back pain, falls, joint pain, myalgias and neck pain.  Skin:  Negative for rash.  Neurological:  Negative for dizziness, tingling, tremors, weakness and headaches.  Endo/Heme/Allergies:  Does not bruise/bleed easily.  Psychiatric/Behavioral:  Negative for depression and suicidal ideas. The patient is not nervous/anxious and does not have insomnia.     Physical Exam: Estimated body mass index is 29.93 kg/m as calculated from the following:   Height as of 06/17/22: 5' 4.35" (1.634 m).   Weight as of 10/28/22: 176 lb 4.8 oz (80 kg). There were no vitals taken for this visit. General Appearance: Well nourished, in no apparent distress.  Eyes: PERRLA, EOMs, conjunctiva no swelling or erythema, normal fundi and vessels.  Sinuses: No Frontal/maxillary tenderness  ENT/Mouth: Ext aud canals clear, normal  light reflex with TMs without erythema, bulging. Good dentition. No erythema, swelling, or exudate on post pharynx. Tonsils not swollen or erythematous. Hearing normal.  Neck: Supple, thyroid normal. No bruits  Respiratory: Respiratory effort normal, BS equal bilaterally without rales, rhonchi, wheezing or stridor.  Cardio: RRR without murmurs, rubs or gallops. Brisk peripheral pulses without edema.  Chest: symmetric, with normal excursions and percussion.  Abdomen: Soft, nontender, no guarding, rebound, hernias, masses, or organomegaly.  Lymphatics: Non tender without lymphadenopathy.  Genitourinary: Deferred Musculoskeletal: Full ROM all peripheral extremities,5/5 strength, and normal gait.  Skin: Warm, dry without rashes, lesions, ecchymosis. Neuro: Cranial nerves intact, reflexes equal bilaterally. Normal muscle tone, no cerebellar symptoms. Sensation intact.  Psych: Awake and oriented X 3, normal affect, Insight and Judgment appropriate.   EKG: NSR, no ST changes  AAA: < 3 cm  Letica Giaimo E  12:33 PM [facility]

## 2023-06-24 ENCOUNTER — Encounter: Payer: 59 | Admitting: Nurse Practitioner

## 2023-06-24 DIAGNOSIS — R7309 Other abnormal glucose: Secondary | ICD-10-CM

## 2023-06-24 DIAGNOSIS — Z125 Encounter for screening for malignant neoplasm of prostate: Secondary | ICD-10-CM

## 2023-06-24 DIAGNOSIS — J4540 Moderate persistent asthma, uncomplicated: Secondary | ICD-10-CM

## 2023-06-24 DIAGNOSIS — J3089 Other allergic rhinitis: Secondary | ICD-10-CM

## 2023-06-24 DIAGNOSIS — N182 Chronic kidney disease, stage 2 (mild): Secondary | ICD-10-CM

## 2023-06-24 DIAGNOSIS — Z79899 Other long term (current) drug therapy: Secondary | ICD-10-CM

## 2023-06-24 DIAGNOSIS — E66812 Obesity, class 2: Secondary | ICD-10-CM

## 2023-06-24 DIAGNOSIS — Z0001 Encounter for general adult medical examination with abnormal findings: Secondary | ICD-10-CM

## 2023-06-24 DIAGNOSIS — Z136 Encounter for screening for cardiovascular disorders: Secondary | ICD-10-CM

## 2023-06-24 DIAGNOSIS — Z1329 Encounter for screening for other suspected endocrine disorder: Secondary | ICD-10-CM

## 2023-06-24 DIAGNOSIS — E559 Vitamin D deficiency, unspecified: Secondary | ICD-10-CM

## 2023-06-24 DIAGNOSIS — Z1389 Encounter for screening for other disorder: Secondary | ICD-10-CM

## 2023-06-24 DIAGNOSIS — K219 Gastro-esophageal reflux disease without esophagitis: Secondary | ICD-10-CM

## 2023-06-24 DIAGNOSIS — E782 Mixed hyperlipidemia: Secondary | ICD-10-CM

## 2023-07-13 ENCOUNTER — Other Ambulatory Visit: Payer: Self-pay | Admitting: Internal Medicine

## 2023-07-15 ENCOUNTER — Encounter: Payer: 59 | Admitting: Nurse Practitioner

## 2023-07-29 NOTE — Progress Notes (Signed)
 Complete Physical  Assessment and Plan:  Ricky Myers was seen today for annual exam.  Diagnoses and all orders for this visit:  Encounter for general adult medical examination with abnormal findings Due Yearly  Hyperlipidemia, mixed -     CBC with Differential/Platelet -     COMPLETE METABOLIC PANEL WITH GFR -     Lipid panel -     TSH -  Discussed strong adherence to diet vs statin use. Pt is very reluctant to use a statin and will work on diet and exercise, also discussed Coronary artery calcium score and may decide to do this in the future  CKD stage 2 -     COMPLETE METABOLIC PANEL WITH GFR -     Microalbumin / creatinine urine ratio  Continue to push fluids, decrease caffeine  Abnormal glucose -     Hemoglobin A1c Continue focusing on diet and exercise  Vitamin D  deficiency -     VITAMIN D  25 Hydroxy (Vit-D Deficiency, Fractures) Not currently on Vit D supplement, may initiate pending results  Screening for ischemic heart disease -     EKG 12-Lead   Screening for thyroid  disorder - TSH  Screening for AAA - U/S ABD RETROPERITONEAL LTD  Screening for hematuria or proteinuria -     Urinalysis, Routine w reflex microscopic -     Microalbumin / creatinine urine ratio  Moderate persistent asthma without complication/Non-Seasonal allergies due to fungal spores  Continue Singulair  daily and Albuterol  as needed.  Practice forced air breathing exercises  Continue Flonase  nasal spray  Overweight Long discussion about weight loss, diet, and exercise Recommended diet heavy in fruits and veggies and low in animal meats, cheeses, and dairy products, appropriate calorie intake Patient will work on decreasing saturated fats and simple carbs, continue exercise Follow up at next visit  Epigastric pain/GERD Was using Nexium  but only PRN so would rather use TUMS Continue behavior/dietary modifications    Medication management -     CBC with Differential/Platelet -     COMPLETE  METABOLIC PANEL WITH GFR -     Magnesium  -     Lipid panel -     TSH -     Hemoglobin A1C w/out eAG -     VITAMIN D  25 Hydroxy (Vit-D Deficiency, Fractures) -     EKG 12-Lead -     US , RETROPERITNL ABD,  LTD -     Urinalysis, Routine w reflex microscopic -     Microalbumin / creatinine urine ratio        Discussed med's effects and SE's. Screening labs and tests as requested with regular follow-up as recommended. Over 40 minutes of exam, counseling, chart review and critical decision making was performed Future Appointments  Date Time Provider Department Center  08/02/2023  3:15 PM Ricky Norris, MD [facility] None  07/31/2024  9:00 AM Ricky Myers E, NP [facility] None    HPI Patient presents for a complete physical. has Asthma; Vitamin D  deficiency; Screening for ischemic heart disease; BMI 26.67; Non-seasonal allergic rhinitis due to fungal spores; Elevated BP without diagnosis of hypertension; Hyperlipidemia, mixed; Abnormal glucose; Moderate persistent asthma; Gastroesophageal reflux disease; Cough; Screening examination for pulmonary tuberculosis; and Fatigue on their problem list.   He  takes Breztri  once daily, singulair  and zyrtec  daily. Breathing has been fairly well controlled.   His blood pressure has been controlled at home without medication, today their BP is BP: 128/80. He does take minoxidil 2.5 mg for alopecia BP Readings  from Last 3 Encounters:  07/30/23 128/80  04/30/23 122/68  10/28/22 114/64    GERD symptoms have been better and not using omeprazole , will occasionally use TUMS.   Has a history of hiatal hernia as a child. He is on fiber supplement daily  Has a history of hemorrhoid, occasionally bleeds.   BMI is Body mass index is 29.76 kg/m., he has been working on diet and exercise. Did not eat as well during holidays. He is goes to the gym - enjoys the gym. Wt Readings from Last 3 Encounters:  07/30/23 173 lb 6.4 oz (78.7 kg)  10/28/22 176 lb 4.8 oz (80  kg)  06/17/22 177 lb 6.4 oz (80.5 kg)  He does workout. He denies chest pain, shortness of breath, dizziness.    He is not on cholesterol medication and denies myalgias. His cholesterol is not at goal. The cholesterol last visit was:   Lab Results  Component Value Date   CHOL 207 (H) 05/26/2022   HDL 45 05/26/2022   LDLCALC  05/26/2022     Comment:     . LDL cholesterol not calculated. Triglyceride levels greater than 400 mg/dL invalidate calculated LDL results. . Reference range: <100 . Desirable range <100 mg/dL for primary prevention;   <70 mg/dL for patients with CHD or diabetic patients  with > or = 2 CHD risk factors. Ricky LDL-C is now calculated using the Martin-Hopkins  calculation, which is a validated novel method providing  better accuracy than the Friedewald equation in the  estimation of LDL-C.  Gladis Myers et al. Ricky Myers. 7986;689(80): 2061-2068  (http://education.[employer].com/faq/FAQ164)    TRIG 627 (H) 05/26/2022   CHOLHDL 4.6 05/26/2022   He has been working on diet and exercise for abnormal glucose. Last A1C in the office was:  Lab Results  Component Value Date   HGBA1C 5.9 (H) 05/26/2022    GFR was decreased in 2021 but has returned to stage 2, is drinking more fluids. Has been drinking a lot of caffeine approx. 8 -10 days Lab Results  Component Value Date   EGFR 72 05/26/2022     Patient is not currently on Vitamin D  supplement.   Lab Results  Component Value Date   VD25OH 63 05/26/2022     Last PSA was: Lab Results  Component Value Date   PSA 0.17 05/16/2020   He is currently on 1 mg Testosterone  once a week through another clinic for low testosterone  Has noticed an increase in energy and libido.   Current Medications:  Current Outpatient Medications on File Prior to Visit  Medication Sig Dispense Refill   acyclovir (ZOVIRAX) 400 MG tablet Take 400 mg by mouth daily.     BREZTRI  AEROSPHERE 160-9-4.8 MCG/ACT AERO INHALE 2 PUFFS INTO THE  LUNGS TWICE DAILY 10.7 g 3   cetirizine  (ZYRTEC  ALLERGY) 10 MG tablet Take 1 tablet (10 mg total) by mouth daily. 32 tablet 6   finasteride  (PROSCAR ) 5 MG tablet TAKE 1 TABLET BY MOUTH EVERY DAY 90 tablet 1   fluticasone  (FLONASE ) 50 MCG/ACT nasal spray Place 2 sprays into both nostrils daily as needed for allergies or rhinitis (for stuffy nose). 16 g 5   minoxidil (LONITEN) 2.5 MG tablet Take 2.5 mg by mouth daily.     montelukast  (SINGULAIR ) 10 MG tablet TAKE 1 TABLET BY MOUTH DAILY FOR ALLERGIES 90 tablet 1   omeprazole  (PRILOSEC) 20 MG capsule Take by mouth.     tretinoin  (RETIN-A ) 0.05 % cream Apply topically at bedtime.  Apply topically at bedtime. 20 g 99   No current facility-administered medications on file prior to visit.   Allergies:  No Known Allergies Health Maintenance:  Immunization History  Administered Date(s) Administered   Influenza Inj Mdck Quad Pf 05/30/2018   Influenza Inj Mdck Quad With Preservative 05/16/2020   Influenza, Seasonal, Injecte, Preservative Fre 04/30/2023   Influenza,inj,Quad PF,6+ Mos 05/26/2021, 05/26/2022   PFIZER(Purple Top)SARS-COV-2 Vaccination 01/07/2020, 01/26/2020   PNEUMOCOCCAL CONJUGATE-20 05/26/2022   PPD Test 04/02/2014, 05/03/2015, 11/10/2016, 04/18/2019, 05/16/2020   Pneumococcal Polysaccharide-23 06/19/2012   Tdap 05/03/2015   Health Maintenance  Topic Date Due   COVID-19 Vaccine (3 - Pfizer risk series) 02/23/2020   DTaP/Tdap/Td (2 - Td or Tdap) 05/02/2025   Pneumococcal Vaccine 17-79 Years old  Completed   INFLUENZA VACCINE  Completed   Hepatitis C Screening  Completed   HIV Screening  Completed   HPV VACCINES  Aged Out     Patient Care Team: Tonita Fallow, MD as PCP - General (Internal Medicine)  Medical History:  has Asthma; Vitamin D  deficiency; Screening for ischemic heart disease; BMI 26.67; Non-seasonal allergic rhinitis due to fungal spores; Elevated BP without diagnosis of hypertension; Hyperlipidemia, mixed;  Abnormal glucose; Moderate persistent asthma; Gastroesophageal reflux disease; Cough; Screening examination for pulmonary tuberculosis; and Fatigue on their problem list. Surgical History:  He  has a past surgical history that includes no past surgery. Family History:  His family history includes Emphysema in his maternal grandmother. Social History:   reports that he has never smoked. He has never been exposed to tobacco smoke. He has never used smokeless tobacco. He reports current alcohol use. He reports that he does not use drugs. Review of Systems:  Review of Systems  Constitutional:  Positive for malaise/fatigue. Negative for chills and fever.  HENT:  Negative for congestion, hearing loss, sinus pain, sore throat and tinnitus.   Eyes:  Negative for blurred vision and double vision.  Respiratory:  Negative for cough, hemoptysis, sputum production, shortness of breath and wheezing.   Cardiovascular:  Positive for chest pain (Pain mid sternal with deep breath or swallowing). Negative for palpitations and leg swelling.  Gastrointestinal:  Negative for abdominal pain, constipation, diarrhea, heartburn, nausea and vomiting.       Hemorrhoids, occasional bleeding  Genitourinary:  Negative for dysuria and urgency.  Musculoskeletal:  Negative for back pain, falls, joint pain, myalgias and neck pain.  Skin:  Negative for rash.  Neurological:  Negative for dizziness, tingling, tremors, weakness and headaches.  Endo/Heme/Allergies:  Does not bruise/bleed easily.  Psychiatric/Behavioral:  Negative for depression and suicidal ideas. The patient is not nervous/anxious and does not have insomnia.     Physical Exam: Estimated body mass index is 29.76 kg/m as calculated from the following:   Height as of this encounter: 5' 4 (1.626 m).   Weight as of this encounter: 173 lb 6.4 oz (78.7 kg). BP 128/80   Pulse 96   Temp 97.7 F (36.5 C)   Resp 16   Ht 5' 4 (1.626 m)   Wt 173 lb 6.4 oz (78.7  kg)   SpO2 96%   BMI 29.76 kg/m  General Appearance: Well nourished, in no apparent distress.  Eyes: PERRLA, EOMs, conjunctiva no swelling or erythema Sinuses: No Frontal/maxillary tenderness  ENT/Mouth: Ext aud canals clear, normal light reflex with TMs without erythema, bulging. Good dentition. No erythema, swelling, or exudate on post pharynx. Tonsils not swollen or erythematous. Hearing normal.  Neck: Supple, thyroid  normal. No  bruits  Respiratory: Respiratory effort normal, BS equal bilaterally without rales, rhonchi, wheezing or stridor.  Cardio: RRR without murmurs, rubs or gallops. Brisk peripheral pulses without edema.  Chest: symmetric, with normal excursions and percussion.  Abdomen: Soft, nontender, no guarding, rebound, hernias, masses, or organomegaly.  Lymphatics: Non tender without lymphadenopathy.  Genitourinary: Deferred Musculoskeletal: Full ROM all peripheral extremities,5/5 strength, and normal gait.  Skin: Warm, dry without rashes, lesions, ecchymosis. Neuro: Cranial nerves intact, reflexes equal bilaterally. Normal muscle tone, no cerebellar symptoms. Sensation intact.  Psych: Awake and oriented X 3, normal affect, Insight and Judgment appropriate.   EKG: NSR, no ST changes AAA: < 3 cm  Abrianna Sidman E Danitra Payano 9:09 AM [facility]

## 2023-07-30 ENCOUNTER — Ambulatory Visit (INDEPENDENT_AMBULATORY_CARE_PROVIDER_SITE_OTHER): Payer: 59 | Admitting: Nurse Practitioner

## 2023-07-30 ENCOUNTER — Encounter: Payer: Self-pay | Admitting: Nurse Practitioner

## 2023-07-30 VITALS — BP 128/80 | HR 96 | Temp 97.7°F | Resp 16 | Ht 64.0 in | Wt 173.4 lb

## 2023-07-30 DIAGNOSIS — Z0000 Encounter for general adult medical examination without abnormal findings: Secondary | ICD-10-CM

## 2023-07-30 DIAGNOSIS — Z1389 Encounter for screening for other disorder: Secondary | ICD-10-CM

## 2023-07-30 DIAGNOSIS — J4540 Moderate persistent asthma, uncomplicated: Secondary | ICD-10-CM

## 2023-07-30 DIAGNOSIS — Z79899 Other long term (current) drug therapy: Secondary | ICD-10-CM

## 2023-07-30 DIAGNOSIS — I700 Atherosclerosis of aorta: Secondary | ICD-10-CM

## 2023-07-30 DIAGNOSIS — Z136 Encounter for screening for cardiovascular disorders: Secondary | ICD-10-CM | POA: Diagnosis not present

## 2023-07-30 DIAGNOSIS — Z1211 Encounter for screening for malignant neoplasm of colon: Secondary | ICD-10-CM

## 2023-07-30 DIAGNOSIS — E559 Vitamin D deficiency, unspecified: Secondary | ICD-10-CM

## 2023-07-30 DIAGNOSIS — E782 Mixed hyperlipidemia: Secondary | ICD-10-CM

## 2023-07-30 DIAGNOSIS — N182 Chronic kidney disease, stage 2 (mild): Secondary | ICD-10-CM

## 2023-07-30 DIAGNOSIS — I10 Essential (primary) hypertension: Secondary | ICD-10-CM

## 2023-07-30 DIAGNOSIS — Z0001 Encounter for general adult medical examination with abnormal findings: Secondary | ICD-10-CM

## 2023-07-30 DIAGNOSIS — Z1329 Encounter for screening for other suspected endocrine disorder: Secondary | ICD-10-CM

## 2023-07-30 DIAGNOSIS — Z125 Encounter for screening for malignant neoplasm of prostate: Secondary | ICD-10-CM

## 2023-07-30 DIAGNOSIS — R7309 Other abnormal glucose: Secondary | ICD-10-CM

## 2023-07-30 DIAGNOSIS — E663 Overweight: Secondary | ICD-10-CM

## 2023-07-30 DIAGNOSIS — K219 Gastro-esophageal reflux disease without esophagitis: Secondary | ICD-10-CM

## 2023-07-30 DIAGNOSIS — E66812 Obesity, class 2: Secondary | ICD-10-CM

## 2023-07-30 NOTE — Patient Instructions (Signed)

## 2023-07-31 LAB — CBC WITH DIFFERENTIAL/PLATELET
Absolute Lymphocytes: 1634 {cells}/uL (ref 850–3900)
Absolute Monocytes: 670 {cells}/uL (ref 200–950)
Basophils Absolute: 50 {cells}/uL (ref 0–200)
Basophils Relative: 0.7 %
Eosinophils Absolute: 122 {cells}/uL (ref 15–500)
Eosinophils Relative: 1.7 %
HCT: 50.5 % — ABNORMAL HIGH (ref 38.5–50.0)
Hemoglobin: 17.1 g/dL (ref 13.2–17.1)
MCH: 31.9 pg (ref 27.0–33.0)
MCHC: 33.9 g/dL (ref 32.0–36.0)
MCV: 94.2 fL (ref 80.0–100.0)
MPV: 9.2 fL (ref 7.5–12.5)
Monocytes Relative: 9.3 %
Neutro Abs: 4723 {cells}/uL (ref 1500–7800)
Neutrophils Relative %: 65.6 %
Platelets: 245 10*3/uL (ref 140–400)
RBC: 5.36 10*6/uL (ref 4.20–5.80)
RDW: 13.2 % (ref 11.0–15.0)
Total Lymphocyte: 22.7 %
WBC: 7.2 10*3/uL (ref 3.8–10.8)

## 2023-07-31 LAB — COMPLETE METABOLIC PANEL WITH GFR
AG Ratio: 2.2 (calc) (ref 1.0–2.5)
ALT: 26 U/L (ref 9–46)
AST: 26 U/L (ref 10–40)
Albumin: 4.6 g/dL (ref 3.6–5.1)
Alkaline phosphatase (APISO): 48 U/L (ref 36–130)
BUN: 25 mg/dL (ref 7–25)
CO2: 28 mmol/L (ref 20–32)
Calcium: 9.7 mg/dL (ref 8.6–10.3)
Chloride: 103 mmol/L (ref 98–110)
Creat: 1.28 mg/dL (ref 0.60–1.29)
Globulin: 2.1 g/dL (ref 1.9–3.7)
Glucose, Bld: 96 mg/dL (ref 65–99)
Potassium: 4.7 mmol/L (ref 3.5–5.3)
Sodium: 138 mmol/L (ref 135–146)
Total Bilirubin: 0.6 mg/dL (ref 0.2–1.2)
Total Protein: 6.7 g/dL (ref 6.1–8.1)
eGFR: 71 mL/min/{1.73_m2} (ref >=60)

## 2023-07-31 LAB — URINALYSIS, ROUTINE W REFLEX MICROSCOPIC
Bilirubin Urine: NEGATIVE
Glucose, UA: NEGATIVE
Hgb urine dipstick: NEGATIVE
Ketones, ur: NEGATIVE
Leukocytes,Ua: NEGATIVE
Nitrite: NEGATIVE
Protein, ur: NEGATIVE
Specific Gravity, Urine: 1.017 (ref 1.001–1.035)
pH: 6.5 (ref 5.0–8.0)

## 2023-07-31 LAB — LIPID PANEL
Cholesterol: 161 mg/dL (ref <200)
HDL: 45 mg/dL (ref >=40)
LDL Cholesterol (Calc): 93 mg/dL
Non-HDL Cholesterol (Calc): 116 mg/dL (ref <130)
Total CHOL/HDL Ratio: 3.6 (calc) (ref <5.0)
Triglycerides: 129 mg/dL (ref <150)

## 2023-07-31 LAB — MICROALBUMIN / CREATININE URINE RATIO
Creatinine, Urine: 77 mg/dL (ref 20–320)
Microalb, Ur: <0.2 mg/dL

## 2023-07-31 LAB — PSA: PSA: 0.22 ng/mL (ref <=4.00)

## 2023-07-31 LAB — HEMOGLOBIN A1C W/OUT EAG: Hgb A1c MFr Bld: 5.6 %{Hb} (ref <5.7)

## 2023-07-31 LAB — VITAMIN D 25 HYDROXY (VIT D DEFICIENCY, FRACTURES): Vit D, 25-Hydroxy: 32 ng/mL (ref 30–100)

## 2023-07-31 LAB — MAGNESIUM: Magnesium: 2.4 mg/dL (ref 1.5–2.5)

## 2023-07-31 LAB — TSH: TSH: 4.71 m[IU]/L — ABNORMAL HIGH (ref 0.40–4.50)

## 2023-08-02 ENCOUNTER — Other Ambulatory Visit: Payer: Self-pay | Admitting: Nurse Practitioner

## 2023-08-02 ENCOUNTER — Ambulatory Visit: Payer: 59 | Admitting: Internal Medicine

## 2023-08-02 DIAGNOSIS — R7989 Other specified abnormal findings of blood chemistry: Secondary | ICD-10-CM

## 2023-08-02 DIAGNOSIS — J309 Allergic rhinitis, unspecified: Secondary | ICD-10-CM

## 2023-08-10 ENCOUNTER — Ambulatory Visit: Payer: 59 | Admitting: Family

## 2023-08-10 ENCOUNTER — Other Ambulatory Visit: Payer: Self-pay

## 2023-08-10 ENCOUNTER — Encounter: Payer: Self-pay | Admitting: Family

## 2023-08-10 VITALS — BP 120/70 | HR 84 | Temp 97.5°F | Resp 16

## 2023-08-10 DIAGNOSIS — J3089 Other allergic rhinitis: Secondary | ICD-10-CM | POA: Diagnosis not present

## 2023-08-10 DIAGNOSIS — K219 Gastro-esophageal reflux disease without esophagitis: Secondary | ICD-10-CM | POA: Diagnosis not present

## 2023-08-10 DIAGNOSIS — J4551 Severe persistent asthma with (acute) exacerbation: Secondary | ICD-10-CM | POA: Diagnosis not present

## 2023-08-10 MED ORDER — ALBUTEROL SULFATE HFA 108 (90 BASE) MCG/ACT IN AERS: INHALATION_SPRAY | RESPIRATORY_TRACT | 1 refills | Status: DC

## 2023-08-10 MED ORDER — LEVALBUTEROL TARTRATE 45 MCG/ACT IN AERO
4.0000 | INHALATION_SPRAY | Freq: Once | RESPIRATORY_TRACT | Status: AC
  Administered 2023-08-10: 4 via RESPIRATORY_TRACT

## 2023-08-10 MED ORDER — PREDNISONE 10 MG PO TABS: ORAL_TABLET | ORAL | 0 refills | Status: DC

## 2023-08-10 MED ORDER — TRELEGY ELLIPTA 200-62.5-25 MCG/ACT IN AEPB: INHALATION_SPRAY | RESPIRATORY_TRACT | 5 refills | Status: DC

## 2023-08-10 NOTE — Patient Instructions (Addendum)
Asthma; severe persistent, with acute exacerbation -Recommend getting pneumovax and RSV vaccine  -Stop Breztri 2 puffs twice a day with a spacer. (Forgetting to take evening dose) -START Trelegy 200 mcg 1 puff once a day to help prevent cough and wheeze. Rinse mouth out after. 2 samples given along with demonstration -Start prednisone 10 mg - taking 2 tablets twice a day for 3 days, then on the 4th day take 2 tablets in the morning, and on the 5th day take 1 tablet and stop -Information given on Tezspire -Continue montelukast 10 mg once a day to prevent cough or wheeze -Continue albuterol 2 puffs every 4 hours as needed for cough or wheeze. Refill sent You may use albuterol 2 puffs 5-15 minutes before activity to reduce cough or wheeze  Asthma control goals:  Full participation in all desired activities (may need albuterol before activity) Albuterol use two time or less a week on average (not counting use with activity) Cough interfering with sleep two time or less a month Oral steroids no more than once a year No hospitalizations   Allergic rhinitis Continue allergen avoidance measures directed toward mold  Continue cetirizine 10 mg once a day as needed for a runny nose or itch.  Continue Flonase 2 sprays in each nostril once a day as needed for a stuffy nose Consider saline nasal rinses as needed for nasal symptoms. Use this before any medicated nasal sprays for best result For thick post nasal drainage, begin Mucinex [phone] mg twice a day and increase fluid intake to thin mucus  Reflux Continue dietary and lifestyle modifications as listed below  Follow up: 4-6 weeks or sooner if needed

## 2023-08-10 NOTE — Progress Notes (Signed)
[address] [place] [address] Dept: [phone]  FOLLOW UP NOTE  Patient ID: Ricky Myers, male    DOB: 02-27-1981  Age: 43 y.o. MRN: [id number] Date of Office Visit: 08/10/2023  Assessment  Chief Complaint: Asthma (Cough started last thursday)  HPI Ricky Myers is a 43 year old male who presents today for an acute visit of cough.  He was last seen on April 30, 2023 by [facility] for not well-controlled moderate persistent asthma, allergic rhinitis, and reflux.  He denies any new diagnosis or surgery since his last office visit.  Asthma: He reports since last Thursday he has had a nonproductive cough.  He reports that he always has wheezing and shortness of breath.  His tightness in his chest is no more than usual.  He has not noticed any nocturnal awakenings due to breathing problems, fever, chills, and bodyaches.  Since his last office visit he has not required any systemic steroids or made any trips to the emergency room or urgent care due to breathing problems.  He reports that he forgets to take his evening dose of Breztri.  He does use Breztri 2 puffs in the morning and 3 puffs if he is feeling adventurous.  He continues to take montelukast 10 mg once a day.  He reports that he does not have an albuterol inhaler.  He lost his and it was probably out of date.  He says he has not had an albuterol inhaler in years.  He is hesitant to starting Tezspire, but not completely against it.  Discussed the benefits of Tezspire and possible side effects.  Allergic rhinitis: He is currently taking cetirizine 10 mg daily and Flonase nasal spray daily.  He denies rhinorrhea, nasal congestion, postnasal drip.  He has not been treated for any sinus infections since we last saw him.  Reflux: He reports its pretty rare for him to have reflux symptoms.  It is worse if he eats foods with tomatoes or drinks beer.  He reports he does have a hiatal hernia.  He mentions that he does not like taking  medicine.   Drug Allergies:  No Known Allergies  Review of Systems: Negative except as per HPI  Physical Exam: BP 120/70   Pulse 84   Temp (!) 97.5 F (36.4 C) (Temporal)   Resp 16   SpO2 96%    Physical Exam Constitutional:      Appearance: Normal appearance.  HENT:     Head: Normocephalic and atraumatic.     Comments: Pharynx normal, eyes normal, ears normal, nose normal    Right Ear: Tympanic membrane, ear canal and external ear normal.     Left Ear: Tympanic membrane, ear canal and external ear normal.     Nose: Nose normal.     Mouth/Throat:     Mouth: Mucous membranes are moist.     Pharynx: Oropharynx is clear.  Eyes:     Conjunctiva/sclera: Conjunctivae normal.  Cardiovascular:     Rate and Rhythm: Regular rhythm.     Heart sounds: Normal heart sounds.  Pulmonary:     Effort: Pulmonary effort is normal.     Breath sounds: Normal breath sounds.     Comments: Lungs clear to auscultation Musculoskeletal:     Cervical back: Neck supple.  Skin:    General: Skin is warm.  Neurological:     Mental Status: He is alert and oriented to person, place, and time.  Psychiatric:  Mood and Affect: Mood normal.        Behavior: Behavior normal.        Thought Content: Thought content normal.        Judgment: Judgment normal.     Diagnostics: FVC 2.76 L (64%), FEV1 2.37 L (67%), FEV1/FVC 0.86.  4 puffs of Xopenex given.  Spirometry indicates possible restrictive defect.  Postbronchodilator shows FVC 2.67 L (61%), FEV1 2.35 L (67%.  There is no change in FEV1.  Spirometry indicates possible restrictive defect  Assessment and Plan: 1. Other allergic rhinitis   2. Severe persistent asthma with (acute) exacerbation   3. Gastroesophageal reflux disease without esophagitis     Meds ordered this encounter  Medications   Fluticasone-Umeclidin-Vilant (TRELEGY ELLIPTA) 200-62.5-25 MCG/ACT AEPB    Sig: Inhale 1 puff once a day to help prevent cough and wheeze. Rinse  mouth out after    Dispense:  28 each    Refill:  5    Patient bringing coupon card   albuterol (VENTOLIN HFA) 108 (90 Base) MCG/ACT inhaler    Sig: Inhale 2 puffs every 4-6 hours as needed for cough, wheeze, tightness in chest, or shortness of breath.    Dispense:  8 g    Refill:  1   predniSONE (DELTASONE) 10 MG tablet    Sig: Take 2 tablets twice a day for 3 days, then on the 4th day take 2 tablets in the morning, and on the 5th day take 1 tablet and stop    Dispense:  15 tablet    Refill:  0   levalbuterol (XOPENEX HFA) inhaler 4 puff    Patient Instructions  Asthma; severe persistent, with acute exacerbation -Recommend getting pneumovax and RSV vaccine  -Stop Breztri 2 puffs twice a day with a spacer. (Forgetting to take evening dose) -START Trelegy 200 mcg 1 puff once a day to help prevent cough and wheeze. Rinse mouth out after. 2 samples given along with demonstration -Start prednisone 10 mg - taking 2 tablets twice a day for 3 days, then on the 4th day take 2 tablets in the morning, and on the 5th day take 1 tablet and stop -Information given on Tezspire -Continue montelukast 10 mg once a day to prevent cough or wheeze -Continue albuterol 2 puffs every 4 hours as needed for cough or wheeze. Refill sent You may use albuterol 2 puffs 5-15 minutes before activity to reduce cough or wheeze  Asthma control goals:  Full participation in all desired activities (may need albuterol before activity) Albuterol use two time or less a week on average (not counting use with activity) Cough interfering with sleep two time or less a month Oral steroids no more than once a year No hospitalizations   Allergic rhinitis Continue allergen avoidance measures directed toward mold  Continue cetirizine 10 mg once a day as needed for a runny nose or itch.  Continue Flonase 2 sprays in each nostril once a day as needed for a stuffy nose Consider saline nasal rinses as needed for nasal symptoms.  Use this before any medicated nasal sprays for best result For thick post nasal drainage, begin Mucinex [phone] mg twice a day and increase fluid intake to thin mucus  Reflux Continue dietary and lifestyle modifications as listed below  Follow up: 4-6 weeks or sooner if needed  Return in about 4 weeks (around 09/07/2023), or if symptoms worsen or fail to improve.    Thank you for the opportunity to care for this  patient.  Please do not hesitate to contact me with questions.  Nehemiah Settle, FNP [facility]

## 2023-08-14 ENCOUNTER — Ambulatory Visit
Admission: EM | Admit: 2023-08-14 | Discharge: 2023-08-14 | Disposition: A | Discharge status: Home / Self Care | Payer: 59 | Attending: Family Medicine | Admitting: Family Medicine

## 2023-08-14 ENCOUNTER — Other Ambulatory Visit: Payer: Self-pay

## 2023-08-14 DIAGNOSIS — J209 Acute bronchitis, unspecified: Secondary | ICD-10-CM | POA: Diagnosis not present

## 2023-08-14 DIAGNOSIS — J0100 Acute maxillary sinusitis, unspecified: Secondary | ICD-10-CM

## 2023-08-14 MED ORDER — PROMETHAZINE-DM 6.25-15 MG/5ML PO SYRP: 5.0000 mL | ORAL_SOLUTION | Freq: Four times a day (QID) | ORAL | 0 refills | Status: DC | PRN

## 2023-08-14 MED ORDER — AMOXICILLIN-POT CLAVULANATE 875-125 MG PO TABS: 1.0000 | ORAL_TABLET | Freq: Two times a day (BID) | ORAL | 0 refills | Status: DC

## 2023-08-14 NOTE — ED Provider Notes (Addendum)
[facility]    CSN: [id number] Arrival date & time: 08/14/23  0845      History   Chief Complaint No chief complaint on file.   HPI Ricky Myers is a 43 y.o. male  presents for evaluation of URI symptoms for 7-8 days. Patient reports associated symptoms of sinus pressure/pain with postnasal drip causing a dry cough. Denies N/V/D, fevers, sore throat, ear pain, body aches, shortness of breath. Patient does have a hx of asthma.  Did see his pulmonologist on 1/21 and was started on prednisone taper.  He states he is been taking as prescribed and has no change in symptoms.  He does have an albuterol inhaler but has not needed to use.  He denies any wheezing.  Patient is not an active smoker.   Reports no sick contacts.  Pt has taken Mucinex OTC for symptoms. Pt has no other concerns at this time.   HPI  Past Medical History:  Diagnosis Date   Asthma     Patient Active Problem List   Diagnosis Date Noted   Screening examination for pulmonary tuberculosis 05/23/2021   Fatigue 05/23/2021   Moderate persistent asthma 05/29/2020   Gastroesophageal reflux disease 05/29/2020   Cough 05/29/2020   Elevated BP without diagnosis of hypertension 01/03/2018   Hyperlipidemia, mixed 01/03/2018   Abnormal glucose 01/03/2018   Non-seasonal allergic rhinitis due to fungal spores 12/21/2017   Vitamin D deficiency 05/03/2015   Screening for ischemic heart disease 05/03/2015   BMI 26.67 05/03/2015   Asthma     Past Surgical History:  Procedure Laterality Date   no past surgery         Home Medications    Prior to Admission medications   Medication Sig Start Date End Date Taking? Authorizing Provider  amoxicillin-clavulanate (AUGMENTIN) 875-125 MG tablet Take 1 tablet by mouth every 12 (twelve) hours. 08/14/23  Yes Radford Pax, NP  promethazine-dextromethorphan (PROMETHAZINE-DM) 6.25-15 MG/5ML syrup Take 5 mLs by mouth 4 (four) times daily as needed for cough. 08/14/23  Yes  Radford Pax, NP  acyclovir (ZOVIRAX) 400 MG tablet Take 400 mg by mouth daily. 05/28/20   [provider]  albuterol (VENTOLIN HFA) 108 (90 Base) MCG/ACT inhaler Inhale 2 puffs every 4-6 hours as needed for cough, wheeze, tightness in chest, or shortness of breath. 08/10/23   Nehemiah Settle, FNP  cetirizine (ZYRTEC ALLERGY) 10 MG tablet Take 1 tablet (10 mg total) by mouth daily. 10/28/22   Ferol Luz, MD  finasteride (PROSCAR) 5 MG tablet TAKE 1 TABLET BY MOUTH EVERY DAY 04/05/23   Adela Glimpse, NP  fluticasone (FLONASE) 50 MCG/ACT nasal spray Place 2 sprays into both nostrils daily as needed for allergies or rhinitis (for stuffy nose). 05/26/22   Raynelle Dick, NP  Fluticasone-Umeclidin-Vilant (TRELEGY ELLIPTA) 200-62.5-25 MCG/ACT AEPB Inhale 1 puff once a day to help prevent cough and wheeze. Rinse mouth out after 08/10/23   Nehemiah Settle, FNP  minoxidil (LONITEN) 2.5 MG tablet Take 2.5 mg by mouth daily. 05/29/22   [provider]  montelukast (SINGULAIR) 10 MG tablet TAKE 1 TABLET BY MOUTH DAILY FOR ALLERGIES 05/10/23   Raynelle Dick, NP  omeprazole (PRILOSEC) 20 MG capsule Take by mouth. 06/03/20   [provider]  predniSONE (DELTASONE) 10 MG tablet Take 2 tablets twice a day for 3 days, then on the 4th day take 2 tablets in the morning, and on the 5th day take 1 tablet and stop 08/10/23  Nehemiah Settle, FNP  tretinoin (RETIN-A) 0.05 % cream Apply topically at bedtime. Apply topically at bedtime. 05/27/22   Lucky Cowboy, MD  triamcinolone cream (KENALOG) 0.1 % SMARTSIG:sparingly Topical Twice Daily 08/06/23   [provider]    Family History Family History  Problem Relation Age of Onset   Emphysema Maternal Grandmother    Angioedema Neg Hx    Asthma Neg Hx    Immunodeficiency Neg Hx    Eczema Neg Hx    Urticaria Neg Hx     Social History Social History   Tobacco Use   Smoking status: Never    Passive exposure: Never    Smokeless tobacco: Never  Vaping Use   Vaping status: Never Used  Substance Use Topics   Alcohol use: Yes    Alcohol/week: 0.0 standard drinks of alcohol    Comment: 1 to 6 beers weekly   Drug use: No     Allergies   Patient has no known allergies.   Review of Systems Review of Systems  HENT:  Positive for congestion, sinus pressure and sinus pain.   Respiratory:  Positive for cough.      Physical Exam Triage Vital Signs ED Triage Vitals  Encounter Vitals Group     BP 08/14/23 0933 119/83     Systolic BP Percentile --      Diastolic BP Percentile --      Pulse --      Resp 08/14/23 0933 18     Temp 08/14/23 0933 97.7 F (36.5 C)     Temp Source 08/14/23 0933 Oral     SpO2 08/14/23 0933 95 %     Weight --      Height --      Head Circumference --      Peak Flow --      Pain Score 08/14/23 0935 0     Pain Loc --      Pain Education --      Exclude from Growth Chart --    No data found.  Updated Vital Signs BP 119/83 (BP Location: Right Arm)   Pulse 76   Temp 97.7 F (36.5 C) (Oral)   Resp 18   SpO2 95%   Visual Acuity Right Eye Distance:   Left Eye Distance:   Bilateral Distance:    Right Eye Near:   Left Eye Near:    Bilateral Near:     Physical Exam Vitals and nursing note reviewed.  Constitutional:      General: He is not in acute distress.    Appearance: Normal appearance. He is not ill-appearing or toxic-appearing.  HENT:     Head: Normocephalic and atraumatic.     Right Ear: Tympanic membrane and ear canal normal.     Left Ear: Tympanic membrane and ear canal normal.     Nose: Congestion present.     Right Turbinates: Swollen and pale.     Left Turbinates: Not swollen or pale.     Right Sinus: Maxillary sinus tenderness and frontal sinus tenderness present.     Left Sinus: No maxillary sinus tenderness or frontal sinus tenderness.     Mouth/Throat:     Mouth: Mucous membranes are moist.     Pharynx: No posterior oropharyngeal  erythema.  Eyes:     Pupils: Pupils are equal, round, and reactive to light.  Cardiovascular:     Rate and Rhythm: Normal rate and regular rhythm.     Heart sounds: Normal heart  sounds.  Pulmonary:     Effort: Pulmonary effort is normal.     Breath sounds: Normal breath sounds. No wheezing or rhonchi.  Musculoskeletal:     Cervical back: Normal range of motion and neck supple.  Lymphadenopathy:     Cervical: No cervical adenopathy.  Skin:    General: Skin is warm and dry.  Neurological:     General: No focal deficit present.     Mental Status: He is alert and oriented to person, place, and time.  Psychiatric:        Mood and Affect: Mood normal.        Behavior: Behavior normal.      UC Treatments / Results  Labs (all labs ordered are listed, but only abnormal results are displayed) Labs Reviewed - No data to display  EKG   Radiology No results found.  Procedures Procedures (including critical care time)  Medications Ordered in UC Medications - No data to display  Initial Impression / Assessment and Plan / UC Course  I have reviewed the triage vital signs and the nursing notes.  Pertinent labs & imaging results that were available during my care of the patient were reviewed by me and considered in my medical decision making (see chart for details).     Reviewed exam and symptoms with patient.  No red flags.  No sign of asthma exacerbation on exam or per reported symptoms from patient.  Will treat for sinusitis with Augmentin.  Promethazine DM as needed for cough, side effect profile reviewed.  He is already on Flonase and will continue this.  Discussed nasal rinses as tolerated.  He has not albuterol inhaler to use as needed.  Continue steroid taper as previously prescribed.  Advised PCP follow-up if symptoms do not improve.  ER precautions reviewed. Final Clinical Impressions(s) / UC Diagnoses   Final diagnoses:  Acute maxillary sinusitis, recurrence not  specified  Acute bronchitis, unspecified organism     Discharge Instructions      Start Augmentin twice daily for 7 days.  You may take Promethazine DM as needed for cough.  Please note this medication can make you drowsy.  Do not drink alcohol or drive while on this medication.  Continue your inhalers and your steroids as previously prescribed.  Lots of rest and fluids.  Please follow-up with your PCP if your symptoms do not improve.  Please go to the ER for any worsening symptoms.  I hope you feel better soon!    ED Prescriptions     Medication Sig Dispense Auth. Provider   amoxicillin-clavulanate (AUGMENTIN) 875-125 MG tablet Take 1 tablet by mouth every 12 (twelve) hours. 14 tablet Radford Pax, NP   promethazine-dextromethorphan (PROMETHAZINE-DM) 6.25-15 MG/5ML syrup Take 5 mLs by mouth 4 (four) times daily as needed for cough. 118 mL Radford Pax, NP      PDMP not reviewed this encounter.   Radford Pax, NP 08/14/23 1009    Radford Pax, NP 08/14/23 1010    Radford Pax, NP 08/14/23 1010

## 2023-08-14 NOTE — Discharge Instructions (Addendum)
Start Augmentin twice daily for 7 days.  You may take Promethazine DM as needed for cough.  Please note this medication can make you drowsy.  Do not drink alcohol or drive while on this medication.  Continue your inhalers and your steroids as previously prescribed.  Lots of rest and fluids.  Please follow-up with your PCP if your symptoms do not improve.  Please go to the ER for any worsening symptoms.  I hope you feel better soon!

## 2023-08-14 NOTE — ED Triage Notes (Signed)
C/O cough x 5 days. Patient states he was seen by his doctor and was placed on a steroid dose for 5 days. Patient states no improvement. C/O wheezing and short of breath. Patient is using inhaler at home.

## 2023-08-15 ENCOUNTER — Ambulatory Visit: Payer: Self-pay

## 2023-08-25 ENCOUNTER — Ambulatory Visit (HOSPITAL_BASED_OUTPATIENT_CLINIC_OR_DEPARTMENT_OTHER)
Admission: RE | Admit: 2023-08-25 | Discharge: 2023-08-25 | Disposition: A | Discharge status: Home / Self Care | Payer: 59 | Source: Ambulatory Visit | Attending: Internal Medicine | Admitting: Internal Medicine

## 2023-08-25 ENCOUNTER — Encounter: Payer: Self-pay | Admitting: Internal Medicine

## 2023-08-25 ENCOUNTER — Ambulatory Visit: Payer: 59 | Admitting: Internal Medicine

## 2023-08-25 VITALS — BP 122/74 | HR 95 | Temp 97.2°F | Resp 18

## 2023-08-25 DIAGNOSIS — U071 COVID-19: Secondary | ICD-10-CM

## 2023-08-25 DIAGNOSIS — J3089 Other allergic rhinitis: Secondary | ICD-10-CM | POA: Diagnosis not present

## 2023-08-25 DIAGNOSIS — J4551 Severe persistent asthma with (acute) exacerbation: Secondary | ICD-10-CM

## 2023-08-25 MED ORDER — BREZTRI AEROSPHERE 160-9-4.8 MCG/ACT IN AERO: 2.0000 | INHALATION_SPRAY | Freq: Two times a day (BID) | RESPIRATORY_TRACT | 5 refills | Status: AC

## 2023-08-25 MED ORDER — METHYLPREDNISOLONE ACETATE 40 MG/ML IJ SUSP
40.0000 mg | Freq: Once | INTRAMUSCULAR | Status: AC
  Administered 2023-08-25: 40 mg via INTRAMUSCULAR

## 2023-08-25 MED ORDER — AZITHROMYCIN 250 MG PO TABS: ORAL_TABLET | ORAL | 0 refills | Status: DC

## 2023-08-25 MED ORDER — MONTELUKAST SODIUM 10 MG PO TABS: ORAL_TABLET | ORAL | 1 refills | Status: AC

## 2023-08-25 MED ORDER — PAXLOVID (300/100) 20 X 150 MG & 10 X 100MG PO TBPK: ORAL_TABLET | ORAL | 0 refills | Status: DC

## 2023-08-25 MED ORDER — PREDNISONE 20 MG PO TABS: 40.0000 mg | ORAL_TABLET | Freq: Every day | ORAL | 0 refills | Status: AC

## 2023-08-25 NOTE — Patient Instructions (Addendum)
 Asthma; severe persistent, with acute exacerbation secondary to COVID-19 INFECTION -Recommend getting pneumovax and RSV vaccine  -RESTART Breztri  2 puffs twice a day with a spacer. Sample given. -Trelegy makes him cough; suspected due to powder formulation 40 mg IM depomedrol in clinic today -CXR STAT -Covid swab today POSITIVE -Paxlovid  - take all 3 tablets together twice daily for the next 5 days. -tomorrow start prednisone  20 mg - take 2 tablets daily for 4 days. -start azithromycin  500 mg (2 tablets) day 1 then 1 tablet daily for the following 4 days. -Recommend starting Tezspire  as discussed at your last visit. -Continue montelukast  10 mg once a day to prevent cough or wheeze -Continue albuterol  2 puffs every 4 hours as needed for cough or wheeze. Refill sent You may use albuterol  2 puffs 5-15 minutes before activity to reduce cough or wheeze  Asthma control goals:  Full participation in all desired activities (may need albuterol  before activity) Albuterol  use two time or less a week on average (not counting use with activity) Cough interfering with sleep two time or less a month Oral steroids no more than once a year No hospitalizations   Allergic rhinitis Continue allergen avoidance measures directed toward mold  Continue cetirizine  10 mg once a day as needed for a runny nose or itch.  Continue Flonase  2 sprays in each nostril once a day as needed for a stuffy nose Consider saline nasal rinses as needed for nasal symptoms. Use this before any medicated nasal sprays for best result For thick post nasal drainage, begin Mucinex [phone] mg twice a day and increase fluid intake to thin mucus  Reflux Continue dietary and lifestyle modifications as listed below  Follow up: 4-6 weeks or sooner if needed with Dr. Lorin

## 2023-08-25 NOTE — Progress Notes (Signed)
 FOLLOW UP Date of Service/Encounter:  08/25/23  Subjective:  Ricky Myers (DOB: 08/01/1980) is a 43 y.o. male who [facility] in re-evaluation of the following: acute visit for cough History obtained from: chart review and patient.  For Review, LV was on 08/10/23  with Wanda Craze, FNP seen for routine follow-up. See below for summary of history and diagnostics.   Therapeutic plans/changes recommended: FEV1 67%, having acute exacerbation. He was started on prednisone  and switched from Breztri  to Trelegy. ---------------------------------------------------------------- Today presents for follow-up. Discussed the use of AI scribe software for clinical note transcription with the patient, who gave verbal consent to proceed.  History of Present Illness   Ricky Myers is a 43 year old male with asthma who presents with persistent cough and fever.  He developed a slight cough on January 16th, which progressively worsened. Initial treatment with prednisone  resulted in insomnia without symptom improvement. Subsequent treatment at urgent care with amoxicillin  and a cough suppressant also failed to alleviate symptoms. This was finished over one week ago.   He continues to experience a persistent cough, which worsened last night, accompanied by chills and back pain localized to his spine. He recorded a fever of 100.70F and had difficulty sleeping due to the cough. No other symptoms were present prior to last night. He has not seen his primary care for these symptoms  He has a history of asthma and was previously on Breztri , which was switched to Trelegy due to compliance issues. However, Trelegy induces coughing, so he has been off his asthma medication altogether. He is non-compliant with inhalers despite taking other medications like acyclovir for fever blisters, which he finds effective.  He has not had any recent testing for COVID or flu, although he  has had a previous experience with COVID medication, Paxlovid , which he took without a prescription during an upper respiratory infection. He is reluctant to take another COVID vaccine due to a past adverse reaction.      All medications reviewed by clinical staff and updated in chart. No new pertinent medical or surgical history except as noted in HPI.  ROS: All others negative except as noted per HPI.   Objective:  BP 122/74   Pulse 95   Temp (!) 97.2 F (36.2 C) (Temporal)   Resp 18   SpO2 97%  There is no height or weight on file to calculate BMI. Physical Exam: General Appearance:  Alert, cooperative, no distress, appears stated age  Head:  Normocephalic, without obvious abnormality, atraumatic  Eyes:  Conjunctiva clear, EOM's intact  Nose: Nares normal, no rhinorrhea  Throat: Lips, tongue normal; teeth and gums normal, moist mucus membranes  Neck: Supple, symmetrical  Lungs:   end-expiratory wheezing, Respirations unlabored, intermittent dry coughing  Heart:  regular rate and rhythm and no murmur, Appears well perfused  Extremities: No edema  Skin: Skin color, texture, turgor normal and no rashes or lesions on visualized portions of skin  Neurologic: No gross deficits   Labs:  Lab Orders  No laboratory test(s) ordered today    Spirometry:  Deferred due to current illness.  Assessment/Plan   Asthma; severe persistent, with acute exacerbation secondary to COVID-19 INFECTION -Recommend getting pneumovax and RSV vaccine  -RESTART Breztri  2 puffs twice a day with a spacer. Sample given. -Trelegy makes him cough; suspected due to powder formulation 40 mg IM depomedrol in clinic today -CXR STAT -Covid swab today POSITIVE -Paxlovid  - take all  3 tablets together twice daily for the next 5 days. -tomorrow start prednisone  20 mg - take 2 tablets daily for 4 days. -start azithromycin  500 mg (2 tablets) day 1 then 1 tablet daily for the following 4 days. -Recommend starting  Tezspire  as discussed at your last visit. -Continue montelukast  10 mg once a day to prevent cough or wheeze -Continue albuterol  2 puffs every 4 hours as needed for cough or wheeze. Refill sent You may use albuterol  2 puffs 5-15 minutes before activity to reduce cough or wheeze  Asthma control goals:  Full participation in all desired activities (may need albuterol  before activity) Albuterol  use two time or less a week on average (not counting use with activity) Cough interfering with sleep two time or less a month Oral steroids no more than once a year No hospitalizations   Allergic rhinitis Continue allergen avoidance measures directed toward mold  Continue cetirizine  10 mg once a day as needed for a runny nose or itch.  Continue Flonase  2 sprays in each nostril once a day as needed for a stuffy nose Consider saline nasal rinses as needed for nasal symptoms. Use this before any medicated nasal sprays for best result For thick post nasal drainage, begin Mucinex [phone] mg twice a day and increase fluid intake to thin mucus  Reflux Continue dietary and lifestyle modifications as listed below  Follow up: 4-6 weeks or sooner if needed with Dr. Lorin  Other: none  Rocky Endow, MD  [facility]   CXR personally reviewed by me prior to closing note. No evidence of focal consolidation. Awaiting final radiology read.

## 2023-08-26 ENCOUNTER — Encounter: Payer: Self-pay | Admitting: Internal Medicine

## 2023-09-08 ENCOUNTER — Other Ambulatory Visit: Payer: Self-pay

## 2023-09-08 ENCOUNTER — Ambulatory Visit: Payer: 59 | Admitting: Internal Medicine

## 2023-09-08 VITALS — BP 132/76 | HR 96 | Temp 97.9°F | Resp 20 | Ht 64.0 in | Wt 171.0 lb

## 2023-09-08 DIAGNOSIS — K219 Gastro-esophageal reflux disease without esophagitis: Secondary | ICD-10-CM

## 2023-09-08 DIAGNOSIS — J3089 Other allergic rhinitis: Secondary | ICD-10-CM

## 2023-09-08 DIAGNOSIS — J4550 Severe persistent asthma, uncomplicated: Secondary | ICD-10-CM | POA: Diagnosis not present

## 2023-09-08 MED ORDER — TEZEPELUMAB-EKKO 210 MG/1.91ML ~~LOC~~ SOSY
210.0000 mg | PREFILLED_SYRINGE | Freq: Once | SUBCUTANEOUS | Status: AC
  Administered 2023-09-08: 210 mg via SUBCUTANEOUS

## 2023-09-08 NOTE — Progress Notes (Signed)
Patient given sample of Tezspire in clinic today by Dr Marlynn Perking   self loading dose  lot [id number]  expiration October 17, 2024 Every 4 weeks for sever asthma

## 2023-09-08 NOTE — Progress Notes (Signed)
FOLLOW UP Date of Service/Encounter:  09/08/23  Subjective:  Ricky Myers (DOB: 1980-08-29) is a 43 y.o. male who [facility]23 in re-evaluation of the following: acute visit for cough History obtained from: chart review and patient.  For Review, LV was on 08/26/23 with Dr.Dennis seen for acute visit for covid  . See below for summary of history and diagnostics.   Therapeutic plans/changes recommended: Diagnosed with COVID 19, given depo medrol, prednisone, azithromycin and paxlovid.  Recommended starting tezspire.   ---------------------------------------------------------------- Today presents for follow-up. Discussed the use of AI scribe software for clinical note transcription with the patient, who gave verbal consent to proceed.  History of Present Illness   Ricky Myers is a 43 year old male with asthma who presents  after recent asthma exacerbation due to COVID 19 infection.  It was discussed starting Tezspire due to the severity of his asthma.  He does have fixed obstruction on spirometry.  He usually has 1-2 exacerbations a year requiring systemic steroids.  He gets approximately 3 puffs of his Breztri daily.  Reports better compliance with his montelukast,, cetirizine, Flonase.  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  He has noticed his asthma is impacting his ability to die for rescue missions.  He would like to do home injections.  And would like to start today if possible.  In regards to recent COVID-19 infection, he has some lingering cough but otherwise shortness of breath, wheezing, chest tightness, fevers have all resolved.  He did complete azithromycin, prednisone and Paxlovid as prescribed  All medications reviewed by clinical staff and updated in chart. No new pertinent medical or surgical history except as noted in HPI.  ROS: All others negative except as noted per HPI.   Objective:  BP 132/76   Pulse 96    Temp 97.9 F (36.6 C) (Temporal)   Resp 20   Ht 5\' 4"  (1.626 m)   Wt 171 lb (77.6 kg)   SpO2 97%   BMI 29.35 kg/m  Body mass index is 29.35 kg/m. Physical Exam: General Appearance:  Alert, cooperative, no distress, appears stated age  Head:  Normocephalic, without obvious abnormality, atraumatic  Eyes:  Conjunctiva clear, EOM's intact  Nose: Nares normal, no rhinorrhea  Throat: Lips, tongue normal; teeth and gums normal, moist mucus membranes  Neck: Supple, symmetrical  Lungs:   clear to auscultation bilaterally, Respirations unlabored, intermittent dry coughing  Heart:  regular rate and rhythm and no murmur, Appears well perfused  Extremities: No edema  Skin: Skin color, texture, turgor normal and no rashes or lesions on visualized portions of skin  Neurologic: No gross deficits   Labs:  Lab Orders  No laboratory test(s) ordered today    Spirometry:  Spirometry:  Tracings reviewed. His effort: Good reproducible efforts. FVC: 2.31L FEV1: 2.12L, 63% predicted FEV1/FVC ratio: 92% Interpretation: Spirometry consistent with mixed obstructive and restrictive disease.  Moderate obstructive pattern  Please see scanned spirometry results for details.   Assessment/Plan   Asthma; severe persistent,  -Recommend getting pneumovax and RSV vaccine  -Continue  Breztri 2 puffs twice a day with a spacer. Sample given. -Start Tezspire injections 110mg  every 4 weeks  -Informed consent obtained today  -Sample given today  - Our Biologic Coordinator Tammy will be reaching out to you in regards to approval and cost  -Continue montelukast 10 mg once a day to prevent cough or wheeze -Continue albuterol 2  puffs every 4 hours as needed for cough or wheeze. Refill sent You may use albuterol 2 puffs 5-15 minutes before activity to reduce cough or wheeze  Asthma control goals:  Full participation in all desired activities (may need albuterol before activity) Albuterol use two time or less a  week on average (not counting use with activity) Cough interfering with sleep two time or less a month Oral steroids no more than once a year No hospitalizations   Allergic rhinitis Continue allergen avoidance measures directed toward mold  Continue cetirizine 10 mg once a day as needed for a runny nose or itch.  Continue Flonase 2 sprays in each nostril once a day as needed for a stuffy nose Consider saline nasal rinses as needed for nasal symptoms. Use this before any medicated nasal sprays for best result For thick post nasal drainage, begin Mucinex [phone] mg twice a day and increase fluid intake to thin mucus  Reflux Continue dietary and lifestyle modifications as listed below  Follow up: in 4 months   Follow up:   Thank you so much for letting me partake in your care today.  Don't hesitate to reach out if you have any additional concerns!  Ferol Luz, MD  [facility] [place] [place]

## 2023-09-08 NOTE — Patient Instructions (Addendum)
Asthma; severe persistent,  -Recommend getting pneumovax and RSV vaccine  -Continue  Breztri 2 puffs twice a day with a spacer. Sample given. -Start Tezspire injections 110mg  every 4 weeks  -Informed consent obtained today  -Sample given today  - Our Biologic Coordinator Tammy will be reaching out to you in regards to approval and cost  -Continue montelukast 10 mg once a day to prevent cough or wheeze -Continue albuterol 2 puffs every 4 hours as needed for cough or wheeze. Refill sent You may use albuterol 2 puffs 5-15 minutes before activity to reduce cough or wheeze  Asthma control goals:  Full participation in all desired activities (may need albuterol before activity) Albuterol use two time or less a week on average (not counting use with activity) Cough interfering with sleep two time or less a month Oral steroids no more than once a year No hospitalizations   Allergic rhinitis Continue allergen avoidance measures directed toward mold  Continue cetirizine 10 mg once a day as needed for a runny nose or itch.  Continue Flonase 2 sprays in each nostril once a day as needed for a stuffy nose Consider saline nasal rinses as needed for nasal symptoms. Use this before any medicated nasal sprays for best result For thick post nasal drainage, begin Mucinex [phone] mg twice a day and increase fluid intake to thin mucus  Reflux Continue dietary and lifestyle modifications as listed below  Follow up: in 4 months   Thank you so much for letting me partake in your care today.  Don't hesitate to reach out if you have any additional concerns!  Ferol Luz, MD  [facility] [place], [place]

## 2023-09-09 ENCOUNTER — Ambulatory Visit: Payer: 59 | Admitting: Family

## 2023-09-28 ENCOUNTER — Other Ambulatory Visit: Payer: 59

## 2023-09-28 ENCOUNTER — Ambulatory Visit: Payer: 59 | Admitting: Internal Medicine

## 2023-09-28 ENCOUNTER — Encounter: Payer: Self-pay | Admitting: Internal Medicine

## 2023-09-28 VITALS — BP 100/60 | HR 83 | Temp 98.4°F | Ht 64.0 in | Wt 168.9 lb

## 2023-09-28 DIAGNOSIS — J4540 Moderate persistent asthma, uncomplicated: Secondary | ICD-10-CM | POA: Diagnosis not present

## 2023-09-28 DIAGNOSIS — E559 Vitamin D deficiency, unspecified: Secondary | ICD-10-CM | POA: Diagnosis not present

## 2023-09-28 DIAGNOSIS — R7989 Other specified abnormal findings of blood chemistry: Secondary | ICD-10-CM | POA: Diagnosis not present

## 2023-09-28 DIAGNOSIS — E782 Mixed hyperlipidemia: Secondary | ICD-10-CM | POA: Diagnosis not present

## 2023-09-28 NOTE — Progress Notes (Signed)
 [facility] [address] [place] [address] Dept: [phone] Dept Fax: [phone]  New Patient Office Visit  Subjective:   Ricky Myers [birth year] 09/28/2023  Chief Complaint  Patient presents with   Establish Care    HPI: WHITLEY STRYCHARZ presents today to establish care at [facility] at [facility]. Introduced to Publishing rights manager role and practice setting.  All questions answered.  Concerns: See below   Discussed the use of AI scribe software for clinical note transcription with the patient, who gave verbal consent to proceed.  History of Present Illness   The patient, a Hydrographic surveyor, presents for a new patient visit following the recent death of his previous provider. He has a history of asthma, which is controlled with Breztri and albuterol as needed. He is managed by [facility]. The patient reports that he does not often need to use his albuterol inhaler and does not experience frequent asthma flare-ups. He also has a history of elevated thyroid levels, low vitamin D, and high cholesterol. The patient has managed these hyperlipidemia through lifestyle changes, including dietary modifications. He expresses a preference for managing his health conditions without medication whenever possible. The patient is active, regularly exercising on a treadmill, and does not report any significant health issues during the visit. Need to recheck thyroid level today. No palpitations, CP, vision changes, extreme lethargy, leg swelling.       The following portions of the patient's history were reviewed and updated as appropriate: past medical history, past surgical history, family history, social history, allergies, medications, and problem list.   Patient Active Problem List   Diagnosis Date Noted   Moderate persistent asthma 05/29/2020   Gastroesophageal reflux disease 05/29/2020   Elevated BP without diagnosis of  hypertension 01/03/2018   Hyperlipidemia, mixed 01/03/2018   Non-seasonal allergic rhinitis due to fungal spores 12/21/2017   Vitamin D deficiency 05/03/2015   BMI 26.67 05/03/2015   Past Medical History:  Diagnosis Date   Asthma    Past Surgical History:  Procedure Laterality Date   no past surgery     Family History  Problem Relation Age of Onset   Emphysema Maternal Grandmother    Angioedema Neg Hx    Asthma Neg Hx    Immunodeficiency Neg Hx    Eczema Neg Hx    Urticaria Neg Hx     Current Outpatient Medications:    acyclovir (ZOVIRAX) 400 MG tablet, Take 400 mg by mouth daily., Disp: , Rfl:    albuterol (VENTOLIN HFA) 108 (90 Base) MCG/ACT inhaler, Inhale 2 puffs every 4-6 hours as needed for cough, wheeze, tightness in chest, or shortness of breath., Disp: 8 g, Rfl: 1   Budeson-Glycopyrrol-Formoterol (BREZTRI AEROSPHERE) 160-9-4.8 MCG/ACT AERO, Inhale 2 puffs into the lungs in the morning and at bedtime., Disp: 10.7 g, Rfl: 5   cetirizine (ZYRTEC ALLERGY) 10 MG tablet, Take 1 tablet (10 mg total) by mouth daily., Disp: 32 tablet, Rfl: 6   finasteride (PROSCAR) 5 MG tablet, TAKE 1 TABLET BY MOUTH EVERY DAY, Disp: 90 tablet, Rfl: 1   fluticasone (FLONASE) 50 MCG/ACT nasal spray, Place 2 sprays into both nostrils daily as needed for allergies or rhinitis (for stuffy nose)., Disp: 16 g, Rfl: 5   minoxidil (LONITEN) 2.5 MG tablet, Take 2.5 mg by mouth daily., Disp: , Rfl:    montelukast (SINGULAIR) 10 MG tablet, TAKE 1 TABLET BY MOUTH DAILY FOR ALLERGIES, Disp: 90 tablet, Rfl: 1  tretinoin (RETIN-A) 0.05 % cream, Apply topically at bedtime. Apply topically at bedtime., Disp: 20 g, Rfl: 99   triamcinolone cream (KENALOG) 0.1 %, SMARTSIG:sparingly Topical Twice Daily, Disp: , Rfl:  No Known Allergies  ROS: A complete ROS was performed with pertinent positives/negatives noted in the HPI. The remainder of the ROS are negative.   Objective:   Today's Vitals   09/28/23 1500  BP:  100/60  Pulse: 83  Temp: 98.4 F (36.9 C)  TempSrc: Temporal  SpO2: 98%  Weight: 168 lb 14.4 oz (76.6 kg)  Height: 5\' 4"  (1.626 m)    GENERAL: Well-appearing, in NAD. Well nourished.  SKIN: Pink, warm and dry. No rash, lesion, ulceration, or ecchymoses.  NECK: Trachea midline. Full ROM w/o pain or tenderness. No lymphadenopathy. No thyromegaly or palpable masses.  RESPIRATORY: Chest wall symmetrical. Respirations even and non-labored. Breath sounds clear to auscultation bilaterally.  CARDIAC: S1, S2 present, regular rate and rhythm. Peripheral pulses 2+ bilaterally.  EXTREMITIES: Without clubbing, cyanosis, or edema.  NEUROLOGIC:  Steady, even gait.  PSYCH/MENTAL STATUS: Alert, oriented x 3. Cooperative, appropriate mood and affect.   Health Maintenance Due  Topic Date Due   COVID-19 Vaccine (3 - Pfizer risk series) 02/23/2020    No results found for any visits on 09/28/23.  Assessment & Plan:  Assessment and Plan    Asthma Asthma well-controlled with Breztri and albuterol. No recent flare-ups or nocturnal symptoms. - Continue Breztri as prescribed. - Use albuterol inhaler as needed.  Abnormal TSH Previous thyroid level slightly elevated but within normal range. No symptoms of thyroid dysfunction. Family history of thyroid cancer noted. - Order TSH and free T4 levels. - Monitor for symptoms of thyroid dysfunction.  Vitamin D deficiency  - Stable from prior lab work  - continue supplement.   Hyperlipidemia  - Recent lipid panel at exam in January 2025, much improved from prior.  - continue healthy diet and regular exercise  - Will re-check at next follow up appointment  General Health Maintenance - Recheck cholesterol and other routine labs in six months.        Orders Placed This Encounter  Procedures   TSH   T4, free   No orders of the defined types were placed in this encounter.   Return in about 6 months (around 03/30/2024) for Chronic Condition follow  up.   Salvatore Decent, FNP

## 2023-09-29 ENCOUNTER — Other Ambulatory Visit: Payer: Self-pay | Admitting: Internal Medicine

## 2023-09-29 ENCOUNTER — Encounter: Payer: Self-pay | Admitting: Internal Medicine

## 2023-09-29 DIAGNOSIS — R0683 Snoring: Secondary | ICD-10-CM

## 2023-09-29 LAB — T4, FREE: Free T4: 0.83 ng/dL (ref 0.60–1.60)

## 2023-09-29 LAB — TSH: TSH: 4.47 u[IU]/mL (ref 0.35–5.50)

## 2023-12-29 ENCOUNTER — Encounter: Payer: Self-pay | Admitting: Neurology

## 2023-12-29 ENCOUNTER — Ambulatory Visit: Admitting: Neurology

## 2023-12-29 VITALS — BP 120/71 | HR 96 | Ht 64.5 in | Wt 172.0 lb

## 2023-12-29 DIAGNOSIS — J3089 Other allergic rhinitis: Secondary | ICD-10-CM | POA: Diagnosis not present

## 2023-12-29 DIAGNOSIS — K219 Gastro-esophageal reflux disease without esophagitis: Secondary | ICD-10-CM | POA: Diagnosis not present

## 2023-12-29 DIAGNOSIS — J4540 Moderate persistent asthma, uncomplicated: Secondary | ICD-10-CM

## 2023-12-29 NOTE — Patient Instructions (Signed)
 Sleep Apnea Test: What to Expect  We ordered a HST ( home sleep test) . Quality Sleep Information, Adult Quality sleep is important for your mental and physical health. It also improves your quality of life. Quality sleep means you: Are asleep for most of the time you are in bed. Fall asleep within 30 minutes. Wake up no more than once a night. Are awake for no longer than 20 minutes if you do wake up during the night. Most adults need 7-8 hours of quality sleep each night. How can poor sleep affect me? If you do not get enough quality sleep, you may have: Mood swings. Daytime sleepiness. Decreased alertness, reaction time, and concentration. Sleep disorders, such as insomnia and sleep apnea. Difficulty with: Solving problems. Coping with stress. Paying attention. These issues may affect your performance and productivity at work, school, and home. Lack of sleep may also put you at higher risk for accidents, suicide, and risky behaviors. If you do not get quality sleep, you may also be at higher risk for several health problems, including: Infections. Type 2 diabetes. Heart disease. High blood pressure. Obesity. Worsening of long-term conditions, like arthritis, kidney disease, depression, Parkinson's disease, and epilepsy. What actions can I take to get more quality sleep? Sleep schedule and routine Stick to a sleep schedule. Go to sleep and wake up at about the same time each day. Do not try to sleep less on weekdays and make up for lost sleep on weekends. This does not work. Limit naps during the day to 30 minutes or less. Do not take naps in the late afternoon. Make time to relax before bed. Reading, listening to music, or taking a hot bath promotes quality sleep. Make your bedroom a place that promotes quality sleep. Keep your bedroom dark, quiet, and at a comfortable room temperature. Make sure your bed is comfortable. Avoid using electronic devices that give off bright blue  light for 30 minutes before bedtime. Your brain perceives bright blue light as sunlight. This includes television, phones, and computers. If you are lying awake in bed for longer than 20 minutes, get up and do a relaxing activity until you feel sleepy. Lifestyle     Try to get at least 30 minutes of exercise on most days. Do not exercise 2-3 hours before going to bed. Do not use any products that contain nicotine or tobacco. These products include cigarettes, chewing tobacco, and vaping devices, such as e-cigarettes. If you need help quitting, ask your health care provider. Do not drink caffeinated beverages for at least 8 hours before going to bed. Coffee, tea, and some sodas contain caffeine. Do not drink alcohol or eat large meals close to bedtime. Try to get at least 30 minutes of sunlight every day. Morning sunlight is best. Medical concerns Work with your health care provider to treat medical conditions that may affect sleeping, such as: Nasal obstruction. Snoring. Sleep apnea and other sleep disorders. Talk to your health care provider if you think any of your prescription medicines may cause you to have difficulty falling or staying asleep. If you have sleep problems, talk with a sleep consultant. If you think you have a sleep disorder, talk with your health care provider about getting evaluated by a specialist. Where to find more information Sleep Foundation: sleepfoundation.org American Academy of Sleep Medicine: aasm.org Centers for Disease Control and Prevention (CDC): TonerPromos.no Contact a health care provider if: You have trouble getting to sleep or staying asleep. You often wake up  very early in the morning and cannot get back to sleep. You have daytime sleepiness. You have daytime sleep attacks of suddenly falling asleep and sudden muscle weakness (narcolepsy). You have a tingling sensation in your legs with a strong urge to move your legs (restless legs syndrome). You stop  breathing briefly during sleep (sleep apnea). You think you have a sleep disorder or are taking a medicine that is affecting your quality of sleep. Summary Most adults need 7-8 hours of quality sleep each night. Getting enough quality sleep is important for your mental and physical health. Make your bedroom a place that promotes quality sleep, and avoid things that may cause you to have poor sleep, such as alcohol, caffeine, smoking, or large meals. Talk to your health care provider if you have trouble falling asleep or staying asleep. This information is not intended to replace advice given to you by your health care provider. Make sure you discuss any questions you have with your health care provider. Document Revised: 10/29/2021 Document Reviewed: 10/29/2021 Elsevier Patient Education  2024 Elsevier Inc.  Sleep apnea is a condition that affects your breathing while you're sleeping. You may have shallow breathing or stop breathing for short periods of time. Sleep apnea screening is a test to check if you're at risk for sleep apnea. The test includes questions. It will only takes a few minutes. Your health care provider may ask you to have this test before a surgery or as part of a physical exam. What are the symptoms of sleep apnea? Snoring. Waking up often at night. Daytime sleepiness. Pauses in breathing. Choking or gasping during sleep. Being annoyed easily. Forgetfulness. Trouble thinking clearly. Depression. Personality changes. Headaches in the morning. Most people with sleep apnea do not know that they have it. What are the advantages of sleep apnea screening? Getting screened for sleep apnea can help: Keep you safer. Your providers need to know whether or not you have sleep apnea, especially if you're having surgery or have other long-term, or chronic, health conditions. Improve your health and help you get a better night's rest. Restful sleep can help you: Have more  energy. Lose weight. Improve high blood pressure. Improve diabetes management. Prevent stroke. Prevent car accidents. What happens before the screening? You may talk with your provider about the screening and what other tests may be recommended based on the screening. What happens during the screening? Screening usually includes being asked a list of questions about your sleep quality. Some questions you may be asked include: Do you snore? Is your sleep restless? Do you have daytime sleepiness? Has a partner or spouse told you that you stop breathing, choke, or gasp during sleep? Have you had trouble concentrating or memory loss? What is your age? What is your neck circumference? To measure your neck, keep your back straight and gently wrap the tape measure around your neck. Put the tape measure at the middle of your neck, between your chin and collarbone. What is your sex assigned at birth? Do you have high blood pressure or are you being treated for high blood pressure? If your screening test is positive, you're at risk for the condition. More tests may be needed to confirm a diagnosis of sleep apnea. What can I expect after the screening? Your provider will go over the results of the screening with you and make recommendations based on the results of the test. Where to find more information You can find screening tools online or at your health  care clinic. To learn more, go to these websites: Centers for Disease Control and Prevention: DiningCalendar.de. Then: Click Health Topics A-Z. Type sleep apnea in the search box. National Heart, Lung, and Blood Institute: BuffaloDryCleaner.gl Contact a health care provider if: You think that you may have sleep apnea. This information is not intended to replace advice given to you by your health care provider. Make sure you discuss any questions you have with your health care provider. Document Revised: 12/12/2022 Document Reviewed: 12/12/2022 Elsevier  Patient Education  2024 [employer].

## 2023-12-29 NOTE — Progress Notes (Signed)
 SLEEP MEDICINE CLINIC    Provider:  Neomia Banner, MD  Primary Care Physician:  Gavin Kast, FNP [address] [place] [address]     Referring Provider: Gavin Kast, Fnp [address] [place],  [place] [address]          Chief Complaint according to patient   Patient presents with:     New Patient (Initial Visit)           HISTORY OF PRESENT ILLNESS:  Ricky Myers is a 43 y.o. male Ricky Myers  who is seen upon PCP referral on 12/29/2023  for a Sleep Consult.   Chief concern according to patient :   Asthma exacerbation , may be related to GERD?      I have the pleasure of seeing Ricky Myers 12/29/23 a right-handed male with a possible sleep disorder.    Sleep relevant medical history: Nocturia/ Enuresis,  no Sleep walking, Tonsillectomy, cervical spine surgery, deviated septum repair? UPPP. Family medical /sleep history: No other family member on CPAP with OSA, insomnia, sleep walkers.    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  He lives in a household with GF, no kids, one dog- He used to work in shifts( Chief Technology Officer,)  Tobacco use: none .   ETOH use : 3-4  days a week / 1-2 ,  Caffeine intake in form of Coffee( ore than 3- a day) Soda(  coke 1-2 ) Tea ( /) Monsters, [employer]-  energy drinks Exercise in form of 3-4 Gym. .   Hobbies : lake water sports. Yard work       Sleep habits are as follows: The patient's dinner time is between before 7  PM. The patient goes to bed at 10 PM and continues to sleep for 6-7-8 hours, wakes for 0-1 bathroom breaks    The preferred sleep position is supine but snores, with the support of 1 pillow.  Dreams are reportedly frequent/vivid. The patient wakes up  by his dog  /with an alarm. 5  AM is the usual rise time. He reports not feeling refreshed or restored in AM, with symptoms such as dry mouth, morning headaches, and residual  fatigue. Naps are taken infrequently, lasting  on WE from  60  minutes and are more refreshing .    Review of Systems: Out of a complete 14 system review, the patient complains of only the following symptoms, and all other reviewed systems are negative.:  Fatigue, sleepiness , snoring,    How likely are you to doze in the following situations: 0 = not likely, 1 = slight chance, 2 = moderate chance, 3 = high chance   Sitting and Reading? Watching Television? Sitting inactive in a public place (theater or meeting)? As a passenger in a car for an hour without a break? Lying down in the afternoon when circumstances permit? Sitting and talking to someone? Sitting quietly after lunch without alcohol? In a car, while stopped for a few minutes in traffic?   Total = 3/ 24 points   FSS endorsed at 11/ 63 points.    Social History   Socioeconomic History   Marital status: Single    Spouse name: Not on file   Number of children: Not on file   Years of education: Not on file   Highest education level: Not on file  Occupational History   Not on file  Tobacco Use   Smoking status: Never    Passive exposure: Never   Smokeless tobacco: Never  Vaping Use   Vaping status: Never Used  Substance and Sexual Activity   Alcohol use: Yes    Alcohol/week: 0.0 standard drinks of alcohol    Comment: 1 to 6 beers weekly   Drug use: No   Sexual activity: Not on file  Other Topics Concern   Not on file  Social History Narrative   Not on file   Social Drivers of Health   Financial Resource Strain: Not on file  Food Insecurity: Low Risk  (01/04/2023)   Received from [facility]   Hunger Vital Sign    Worried About Running Out of Food in the Last Year: Never true    Ran Out of Food in the Last Year: Never true  Transportation Needs: Not on file (01/04/2023)  Physical Activity: Not on file  Stress: Not on file  Social Connections: Not on file    Family History  Problem Relation Age of  Onset   Emphysema Maternal Grandmother    Angioedema Neg Hx    Asthma Neg Hx    Immunodeficiency Neg Hx    Eczema Neg Hx    Urticaria Neg Hx     Past Medical History:  Diagnosis Date   Asthma     Past Surgical History:  Procedure Laterality Date   no past surgery       Current Outpatient Medications on File Prior to Visit  Medication Sig Dispense Refill   acyclovir (ZOVIRAX) 400 MG tablet Take 400 mg by mouth daily.     albuterol  (VENTOLIN  HFA) 108 (90 Base) MCG/ACT inhaler Inhale 2 puffs every 4-6 hours as needed for cough, wheeze, tightness in chest, or shortness of breath. 8 g 1   Budeson-Glycopyrrol-Formoterol (BREZTRI  AEROSPHERE) 160-9-4.8 MCG/ACT AERO Inhale 2 puffs into the lungs in the morning and at bedtime. 10.7 g 5   cetirizine  (ZYRTEC  ALLERGY) 10 MG tablet Take 1 tablet (10 mg total) by mouth daily. 32 tablet 6   finasteride  (PROSCAR ) 5 MG tablet TAKE 1 TABLET BY MOUTH EVERY DAY 90 tablet 1   fluticasone  (FLONASE ) 50 MCG/ACT nasal spray Place 2 sprays into both nostrils daily as needed for allergies or rhinitis (for stuffy nose). 16 g 5   minoxidil (LONITEN) 2.5 MG tablet Take 2.5 mg by mouth daily.     montelukast  (SINGULAIR ) 10 MG tablet TAKE 1 TABLET BY MOUTH DAILY FOR ALLERGIES 90 tablet 1   tretinoin  (RETIN-A ) 0.05 % cream Apply topically at bedtime. Apply topically at bedtime. 20 g 99   triamcinolone cream (KENALOG) 0.1 % SMARTSIG:sparingly Topical Twice Daily     No current facility-administered medications on file prior to visit.    No Known Allergies   DIAGNOSTIC DATA (LABS, IMAGING, TESTING) - I reviewed patient records, labs, notes, testing and imaging myself where available.  Lab Results  Component Value Date   WBC 7.2 07/30/2023   HGB 17.1 07/30/2023   HCT 50.5 (H) 07/30/2023   MCV 94.2 07/30/2023   PLT 245 07/30/2023      Component Value Date/Time   NA 138 07/30/2023 0957   K 4.7 07/30/2023 0957   CL 103 07/30/2023 0957   CO2 28  07/30/2023 0957   GLUCOSE 96 07/30/2023 0957   BUN 25 07/30/2023 0957   CREATININE 1.28 07/30/2023 0957   CALCIUM 9.7 07/30/2023 0957   PROT 6.7 07/30/2023 0957   ALBUMIN 4.6 05/03/2015 1001  AST 26 07/30/2023 0957   ALT 26 07/30/2023 0957   ALKPHOS 63 05/03/2015 1001   BILITOT 0.6 07/30/2023 0957   GFRNONAA 53 (L) 05/16/2020 1337   GFRAA 61 05/16/2020 1337   Lab Results  Component Value Date   CHOL 161 07/30/2023   HDL 45 07/30/2023   LDLCALC 93 07/30/2023   TRIG 129 07/30/2023   CHOLHDL 3.6 07/30/2023   Lab Results  Component Value Date   HGBA1C 5.6 07/30/2023   Lab Results  Component Value Date   VITAMINB12 752 05/16/2020   Lab Results  Component Value Date   TSH 4.47 09/28/2023    PHYSICAL EXAM:  Today's Vitals   12/29/23 0850  BP: 120/71  Pulse: 96  Weight: 172 lb (78 kg)  Height: 5' 4.5 (1.638 m)   Body mass index is 29.07 kg/m.   Wt Readings from Last 3 Encounters:  12/29/23 172 lb (78 kg)  09/28/23 168 lb 14.4 oz (76.6 kg)  09/08/23 171 lb (77.6 kg)     Ht Readings from Last 3 Encounters:  12/29/23 5' 4.5 (1.638 m)  09/28/23 5' 4 (1.626 m)  09/08/23 5' 4 (1.626 m)      General: The patient is awake, alert and appears not in acute distress. The patient is well groomed. Head: Normocephalic, atraumatic. Neck is supple. Mallampati 3,  neck circumference:18 inches . Nasal airflow  patent.   Retrognathia is  seen.  Dental status: biological  Cardiovascular:  Regular rate and cardiac rhythm by pulse,  without distended neck veins. Respiratory: Lungs are clear to auscultation.  Skin:  Without evidence of ankle edema, or rash. Trunk: The patient's posture is erect.   NEUROLOGIC EXAM: The patient is awake and alert, oriented to place and time.   Memory subjective described as intact.  Attention span & concentration ability appears normal.  Speech is fluent,  without  dysarthria, dysphonia or aphasia.  Mood and affect are appropriate.    Cranial nerves: no loss of smell or taste reported  Pupils are equal and briskly reactive to light. Funduscopic exam deferred.  Extraocular movements in vertical and horizontal planes were intact and without nystagmus. No Diplopia. Visual fields by finger perimetry are intact. Hearing was intact to soft voice and finger rubbing.    Facial sensation intact to fine touch.  Facial motor strength is symmetric and tongue and uvula move midline.  Neck ROM : rotation, tilt and flexion extension were normal for age and shoulder shrug was symmetrical.    Motor exam:  Symmetric bulk, tone and ROM.   Normal tone without cog wheeling, symmetric grip strength .   Sensory:  Fine touch, vibration were normal.  Proprioception tested in the upper extremities was normal.   Coordination: Rapid alternating movements in the fingers/hands were of normal speed.  The Finger-to-nose maneuver was intact without evidence of ataxia, dysmetria or tremor.   Gait and station: Patient could rise unassisted from a seated position, walked without assistive device.  Stance is of normal width/ base .  Toe and heel walk were deferred.  Deep tendon reflexes: in the  upper and lower extremities are symmetric and intact.  Babinski response was deferred.    ASSESSMENT AND PLAN 43 y.o. year old male  here with:    1) Screening for OSA because he snores.  He is aware that he snores more when he drank or when he sleeps on his back.   Plan : HST enough to screen for sleep apnea.  If he snores, he will prefer a dental device.  If he has moderate apnea , he would still try a dental device rather than CPAP.   I plan to follow up either personally or through our NP within 3-4 months.   I would like to thank Gavin Kast, Fnp [address] [place],  [place] [address] for allowing me to meet with and to take care of this pleasant patient.   CC: I will share my notes with pCP.  After spending a total time of  35   minutes face to face and additional time for physical and neurologic examination, review of laboratory studies,  personal review of imaging studies, reports and results of other testing and review of referral information / records as far as provided in visit,   Electronically signed by: Neomia Banner, MD 12/29/2023 9:33 AM  [facility] Board certified by The [employer] of Sleep Medicine and Diplomate of the [employer] of Sleep Medicine. Board certified In Neurology through the ABPN, Fellow of the [employer] of Neurology.

## 2024-01-07 ENCOUNTER — Ambulatory Visit (INDEPENDENT_AMBULATORY_CARE_PROVIDER_SITE_OTHER): Admitting: Neurology

## 2024-01-07 DIAGNOSIS — J3089 Other allergic rhinitis: Secondary | ICD-10-CM

## 2024-01-07 DIAGNOSIS — J4540 Moderate persistent asthma, uncomplicated: Secondary | ICD-10-CM

## 2024-01-07 DIAGNOSIS — K219 Gastro-esophageal reflux disease without esophagitis: Secondary | ICD-10-CM

## 2024-01-07 DIAGNOSIS — G4733 Obstructive sleep apnea (adult) (pediatric): Secondary | ICD-10-CM

## 2024-01-14 NOTE — Progress Notes (Signed)
 [facility]  Ricky Myers 44 year old male [birth year]   HOME SLEEP TEST REPORT ( by Elene  mail -out device )    Study Protocol:     The SANSA single-point-of-skin-contact chest-worn sensor - an FDA cleared and DOT approved type 4 home sleep test device - measures eight physiological channels,  including blood oxygen saturation (measured via PPG [photoplethysmography]), EKG-derived heart rate, respiratory effort, chest movement (measured via accelerometer), snoring, body position, and actigraphy. The device is designed to be worn for up to 10 hours per study.    STUDY DATE:  01-10-2024 Data received  01-14-224,AM:    ORDERING CLINICIAN: [name], MD  REFERRING CLINICIAN:  Billy Knee, FNP    CLINICAL INFORMATION/HISTORY:43 y.o. year old male  here for Screening for OSA because he snores.  He is aware that he snores more when he drank or when he sleeps on his back.    Plan : HST enough to screen for sleep apnea.  If he snores, he will prefer a dental device.  If he has moderate apnea , he would still try a dental device rather than CPAP.    Epworth sleepiness score: 3/ 24 points   FSS endorsed at 11/ 63 points.   BMI: 28.6 kg/m  Neck Circumference: 18   FINDINGS:  Sleep Summary:   Total Recording Time (hours, min):   8 hours 35 minutes, beginning on 01/10/2024 at 9:54 PM.   Total Sleep Time (hours, min):   6 hours 51 minutes, following a sleep latency of 43 minutes.  Sleep efficiency %; 80%                                       Respiratory Indices by AASM criteria of scoring;    Calculated pAHI (per hour):    11.1/h-this constitutes mild obstructive sleep apnea.                                             Positional  respiratory activity  / snoring : Snoring was clearly present intermittently and was loudest when sleeping in supine position. Prone, right lateral sleep and sleeping in reclined position were all associated with AHI is less  than 10.  Oxygen Saturation  in Sleep    Oxygen Saturation (%) Mean:   97%                O2 Saturation Range (%): Between the nadir at 76.2% with a maximal saturation of 100%.                                      O2 Saturation (minutes) <89%:        2 minutes   Pulse Rate in Sleep :   Pulse Mean (bpm):   73 bpm              Pulse Range:   Between 56 and 91 bpm during sleep, all in normal sinus rhythm.              IMPRESSION:  This HST confirms the presence of very mild sleep apnea which seem to be exacerbated by supine  sleep. Snoring was also exacerbated in supine sleep and definitely present.   RECOMMENDATION:  #1 avoid supine sleep.  Sleep in the lateral position or in reclined position was associated with an AHI too low to require intervention.  The overall AHI however at 11.1/h would allow for the prescription of a dental device and should be insurance coverage.  We are using the dental device to treat a medical diagnosis of sleep apnea and snoring by dental means.  I would facilitate the referral to one of the sleep dentists in town unless the patient has already a preference.  A revisit after receiving the dental device is optional.   Any Patient endorsing a high level of sleepiness should be cautioned not to drive, work at heights, or operate dangerous machinery or heavy equipment when tired or sleepy.  Review of good sleep hygiene measures took place in the initial consultation but should be revisited ( Your guide to better sleep  a publication by the NIH is a good source of information).   The referring provider will be notified of the test results.    I certify that I have reviewed the raw data recording prior to the issuance of this report in accordance with the standards of the American Academy of Sleep Medicine (AASM).    INTERPRETING PHYSICIAN:   [name], MD  [facility] and [facility] Sleep Board certified by The [employer] of  Sleep Medicine and Diplomate of the [employer] of Sleep Medicine. Board certified In Neurology through the ABPN, Fellow of the [employer] of Neu[employer].

## 2024-01-31 ENCOUNTER — Ambulatory Visit: Payer: Self-pay | Admitting: Neurology

## 2024-01-31 DIAGNOSIS — G4733 Obstructive sleep apnea (adult) (pediatric): Secondary | ICD-10-CM | POA: Insufficient documentation

## 2024-01-31 NOTE — Procedures (Signed)
 [facility]  Ricky Myers 43 year old male 09-06-1980   HOME SLEEP TEST REPORT ( by Elene  mail -out device )    Study Protocol:     The SANSA single-point-of-skin-contact chest-worn sensor - an FDA cleared and DOT approved type 4 home sleep test device - measures eight physiological channels,  including blood oxygen saturation (measured via PPG [photoplethysmography]), EKG-derived heart rate, respiratory effort, chest movement (measured via accelerometer), snoring, body position, and actigraphy. The device is designed to be worn for up to 10 hours per study.    STUDY DATE:  01-10-2024 Data received  01-14-224,AM:    ORDERING CLINICIAN: [name], MD  REFERRING CLINICIAN:  Billy Knee, FNP    CLINICAL INFORMATION/HISTORY:43 y.o. year old male  here for Screening for OSA because he snores.  He is aware that he snores more when he drank or when he sleeps on his back.    Plan : HST enough to screen for sleep apnea.  If he snores, he will prefer a dental device.  If he has moderate apnea , he would still try a dental device rather than CPAP.    Epworth sleepiness score: 3/ 24 points   FSS endorsed at 11/ 63 points.   BMI: 28.6 kg/m  Neck Circumference: 18   FINDINGS:  Sleep Summary:   Total Recording Time (hours, min):   8 hours 35 minutes, beginning on 01/10/2024 at 9:54 PM.   Total Sleep Time (hours, min):   6 hours 51 minutes, following a sleep latency of 43 minutes.  Sleep efficiency %; 80%                                       Respiratory Indices by AASM criteria of scoring;    Calculated pAHI (per hour):    11.1/h-this constitutes mild obstructive sleep apnea.                                             Positional  respiratory activity  / snoring : Snoring was clearly present intermittently and was loudest when sleeping in supine position. Prone, right lateral sleep and sleeping in reclined position were all associated with AHI is less than  10.  Oxygen Saturation  in Sleep    Oxygen Saturation (%) Mean:   97%                O2 Saturation Range (%): Between the nadir at 76.2% with a maximal saturation of 100%.                                      O2 Saturation (minutes) <89%:        2 minutes   Pulse Rate in Sleep :   Pulse Mean (bpm):   73 bpm              Pulse Range:   Between 56 and 91 bpm during sleep, all in normal sinus rhythm.              IMPRESSION:  This HST confirms the presence of very mild sleep apnea which seem to be exacerbated by supine sleep. Snoring was  also exacerbated in supine sleep and definitely present.   RECOMMENDATION:  #1 avoid supine sleep.  Sleep in the lateral position or in reclined position was associated with an AHI too low to require intervention.  The overall AHI however at 11.1/h would allow for the prescription of a dental device and should be insurance coverage.  We are using the dental device to treat a medical diagnosis of sleep apnea and snoring by dental means.  I would facilitate the referral to one of the sleep dentists in town unless the patient has already a preference.  A revisit after receiving the dental device is optional.   Any Patient endorsing a high level of sleepiness should be cautioned not to drive, work at heights, or operate dangerous machinery or heavy equipment when tired or sleepy.  Review of good sleep hygiene measures took place in the initial consultation but should be revisited ( Your guide to better sleep  a publication by the NIH is a good source of information).   The referring provider will be notified of the test results.    I certify that I have reviewed the raw data recording prior to the issuance of this report in accordance with the standards of the American Academy of Sleep Medicine (AASM).    INTERPRETING PHYSICIAN:   [name], MD  [facility] and [facility] Sleep Board certified by The [employer] of Sleep  Medicine and Diplomate of the [employer] Sleep Medicine. Board certified In Neurology through the ABPN, Fellow of the [employer].

## 2024-02-01 ENCOUNTER — Ambulatory Visit: Payer: 59 | Admitting: Nurse Practitioner

## 2024-02-02 ENCOUNTER — Telehealth: Payer: Self-pay | Admitting: Neurology

## 2024-02-02 NOTE — Telephone Encounter (Signed)
 Referral to Dentistry Faxed to [facility]  [employer] [facility] Phone:[phone] Fax:[phone]

## 2024-02-14 ENCOUNTER — Other Ambulatory Visit: Payer: Self-pay | Admitting: Internal Medicine

## 2024-02-14 MED ORDER — FINASTERIDE 5 MG PO TABS: ORAL_TABLET | ORAL | 1 refills | Status: AC

## 2024-02-14 NOTE — Telephone Encounter (Signed)
 Copied from CRM [id number]. Topic: Clinical - Medication Refill >> Feb 14, 2024  2:54 PM Viola F wrote: Medication: finasteride  (PROSCAR ) 5 MG tablet [[id number]]  Has the patient contacted their pharmacy? Yes (Agent: If no, request that the patient contact the pharmacy for the refill. If patient does not wish to contact the pharmacy document the reason why and proceed with request.) (Agent: If yes, when and what did the pharmacy advise?)  This is the patient's preferred pharmacy:  [facility] STORE #[id number] [address], [place] - [address] AT [facility] [address] [place] [address] Phone: [phone] Fax: [phone]  Is this the correct pharmacy for this prescription? Yes If no, delete pharmacy and type the correct one.   Has the prescription been filled recently? Yes  Is the patient out of the medication? Yes, patient has been out for 2 weeks   Has the patient been seen for an appointment in the last year OR does the patient have an upcoming appointment? Yes  Can we respond through MyChart? Yes  Agent: Please be advised that Rx refills may take up to 3 business days. We ask that you follow-up with your pharmacy.

## 2024-02-14 NOTE — Telephone Encounter (Signed)
 Left message that Rx refill approved.

## 2024-04-04 ENCOUNTER — Ambulatory Visit: Admitting: Internal Medicine

## 2024-05-30 ENCOUNTER — Encounter: Payer: 59 | Admitting: Nurse Practitioner

## 2024-06-12 ENCOUNTER — Encounter: Payer: 59 | Admitting: Nurse Practitioner

## 2024-06-23 ENCOUNTER — Encounter: Payer: 59 | Admitting: Nurse Practitioner

## 2024-07-09 ENCOUNTER — Other Ambulatory Visit: Payer: Self-pay | Admitting: Family

## 2024-07-17 ENCOUNTER — Encounter: Payer: 59 | Admitting: Nurse Practitioner

## 2024-07-31 ENCOUNTER — Encounter: Payer: 59 | Admitting: Nurse Practitioner
